# Patient Record
Sex: Female | Born: 1935 | Race: White | Hispanic: No | Smoking: Never smoker
Health system: Southern US, Community
[De-identification: ages and names within clinical notes are randomized; demographics above are authoritative.]

## PROBLEM LIST (undated history)

## (undated) DIAGNOSIS — M899 Disorder of bone, unspecified: Secondary | ICD-10-CM

## (undated) DIAGNOSIS — W19XXXA Unspecified fall, initial encounter: Secondary | ICD-10-CM

## (undated) DIAGNOSIS — E039 Hypothyroidism, unspecified: Secondary | ICD-10-CM

## (undated) DIAGNOSIS — S42301A Unspecified fracture of shaft of humerus, right arm, initial encounter for closed fracture: Secondary | ICD-10-CM

## (undated) DIAGNOSIS — E669 Obesity, unspecified: Secondary | ICD-10-CM

## (undated) DIAGNOSIS — M1990 Unspecified osteoarthritis, unspecified site: Secondary | ICD-10-CM

## (undated) DIAGNOSIS — S83209A Unspecified tear of unspecified meniscus, current injury, unspecified knee, initial encounter: Secondary | ICD-10-CM

## (undated) DIAGNOSIS — I10 Essential (primary) hypertension: Secondary | ICD-10-CM

## (undated) DIAGNOSIS — K219 Gastro-esophageal reflux disease without esophagitis: Secondary | ICD-10-CM

## (undated) DIAGNOSIS — Z8639 Personal history of other endocrine, nutritional and metabolic disease: Secondary | ICD-10-CM

## (undated) DIAGNOSIS — M7120 Synovial cyst of popliteal space [Baker], unspecified knee: Secondary | ICD-10-CM

## (undated) HISTORY — PX: SHOULDER SURGERY: SHX246

## (undated) HISTORY — PX: FOOT SURGERY: SHX648

## (undated) HISTORY — PX: DILATION AND CURETTAGE OF UTERUS: SHX78

## (undated) HISTORY — PX: EYE SURGERY: SHX253

## (undated) HISTORY — PX: ANKLE SURGERY: SHX546

## (undated) HISTORY — PX: TUBAL LIGATION: SHX77

---

## 1972-10-28 HISTORY — PX: ABDOMINAL HYSTERECTOMY: SHX81

## 2009-07-10 ENCOUNTER — Encounter: Admission: RE | Admit: 2009-07-10 | Discharge: 2009-07-10 | Payer: Self-pay | Admitting: Orthopedic Surgery

## 2009-07-12 ENCOUNTER — Ambulatory Visit (HOSPITAL_BASED_OUTPATIENT_CLINIC_OR_DEPARTMENT_OTHER): Admission: RE | Admit: 2009-07-12 | Discharge: 2009-07-13 | Payer: Self-pay | Admitting: Orthopedic Surgery

## 2011-02-01 LAB — BASIC METABOLIC PANEL
BUN: 19 mg/dL (ref 6–23)
CO2: 30 mEq/L (ref 19–32)
Chloride: 106 mEq/L (ref 96–112)
Creatinine, Ser: 1.23 mg/dL — ABNORMAL HIGH (ref 0.4–1.2)
Glucose, Bld: 146 mg/dL — ABNORMAL HIGH (ref 70–99)

## 2015-12-22 ENCOUNTER — Other Ambulatory Visit: Payer: Self-pay | Admitting: Orthopedic Surgery

## 2015-12-22 DIAGNOSIS — M1712 Unilateral primary osteoarthritis, left knee: Secondary | ICD-10-CM

## 2016-01-05 ENCOUNTER — Ambulatory Visit
Admission: RE | Admit: 2016-01-05 | Discharge: 2016-01-05 | Disposition: A | Discharge status: Home / Self Care | Payer: Self-pay | Source: Ambulatory Visit | Attending: Orthopedic Surgery | Admitting: Orthopedic Surgery

## 2016-01-05 ENCOUNTER — Ambulatory Visit
Admission: RE | Admit: 2016-01-05 | Discharge: 2016-01-05 | Disposition: A | Discharge status: Home / Self Care | Payer: Medicare Other | Source: Ambulatory Visit | Attending: Orthopedic Surgery | Admitting: Orthopedic Surgery

## 2016-01-05 DIAGNOSIS — M1712 Unilateral primary osteoarthritis, left knee: Secondary | ICD-10-CM

## 2016-03-30 ENCOUNTER — Ambulatory Visit: Payer: Self-pay | Admitting: Orthopedic Surgery

## 2016-05-01 ENCOUNTER — Encounter (HOSPITAL_COMMUNITY): Payer: Self-pay

## 2016-05-01 ENCOUNTER — Other Ambulatory Visit: Payer: Self-pay

## 2016-05-01 ENCOUNTER — Encounter (HOSPITAL_COMMUNITY)
Admission: RE | Admit: 2016-05-01 | Discharge: 2016-05-01 | Disposition: A | Discharge status: Home / Self Care | Payer: Medicare Other | Source: Ambulatory Visit | Attending: Orthopedic Surgery | Admitting: Orthopedic Surgery

## 2016-05-01 DIAGNOSIS — I10 Essential (primary) hypertension: Secondary | ICD-10-CM | POA: Insufficient documentation

## 2016-05-01 DIAGNOSIS — Z01812 Encounter for preprocedural laboratory examination: Secondary | ICD-10-CM | POA: Diagnosis not present

## 2016-05-01 DIAGNOSIS — Z01818 Encounter for other preprocedural examination: Secondary | ICD-10-CM | POA: Insufficient documentation

## 2016-05-01 DIAGNOSIS — I440 Atrioventricular block, first degree: Secondary | ICD-10-CM | POA: Diagnosis not present

## 2016-05-01 DIAGNOSIS — M1712 Unilateral primary osteoarthritis, left knee: Secondary | ICD-10-CM | POA: Insufficient documentation

## 2016-05-01 DIAGNOSIS — Z0183 Encounter for blood typing: Secondary | ICD-10-CM | POA: Insufficient documentation

## 2016-05-01 HISTORY — DX: Personal history of other endocrine, nutritional and metabolic disease: Z86.39

## 2016-05-01 HISTORY — DX: Unspecified fracture of shaft of humerus, right arm, initial encounter for closed fracture: S42.301A

## 2016-05-01 HISTORY — DX: Unspecified osteoarthritis, unspecified site: M19.90

## 2016-05-01 HISTORY — DX: Gastro-esophageal reflux disease without esophagitis: K21.9

## 2016-05-01 HISTORY — DX: Essential (primary) hypertension: I10

## 2016-05-01 HISTORY — DX: Disorder of bone, unspecified: M89.9

## 2016-05-01 HISTORY — DX: Hypothyroidism, unspecified: E03.9

## 2016-05-01 HISTORY — DX: Unspecified tear of unspecified meniscus, current injury, unspecified knee, initial encounter: S83.209A

## 2016-05-01 HISTORY — DX: Unspecified fall, initial encounter: W19.XXXA

## 2016-05-01 HISTORY — DX: Synovial cyst of popliteal space (Baker), unspecified knee: M71.20

## 2016-05-01 HISTORY — DX: Obesity, unspecified: E66.9

## 2016-05-01 LAB — PROTIME-INR
INR: 0.96 (ref 0.00–1.49)
Prothrombin Time: 13 seconds (ref 11.6–15.2)

## 2016-05-01 LAB — COMPREHENSIVE METABOLIC PANEL
ALBUMIN: 4.5 g/dL (ref 3.5–5.0)
ALK PHOS: 71 U/L (ref 38–126)
ALT: 27 U/L (ref 14–54)
AST: 25 U/L (ref 15–41)
Anion gap: 8 (ref 5–15)
BILIRUBIN TOTAL: 0.8 mg/dL (ref 0.3–1.2)
BUN: 18 mg/dL (ref 6–20)
CO2: 29 mmol/L (ref 22–32)
CREATININE: 1.05 mg/dL — AB (ref 0.44–1.00)
Calcium: 10.1 mg/dL (ref 8.9–10.3)
Chloride: 100 mmol/L — ABNORMAL LOW (ref 101–111)
GFR calc Af Amer: 57 mL/min — ABNORMAL LOW (ref >60)
GFR, EST NON AFRICAN AMERICAN: 50 mL/min — AB (ref >60)
GLUCOSE: 143 mg/dL — AB (ref 65–99)
POTASSIUM: 3.6 mmol/L (ref 3.5–5.1)
Sodium: 137 mmol/L (ref 135–145)
TOTAL PROTEIN: 6.8 g/dL (ref 6.5–8.1)

## 2016-05-01 LAB — URINALYSIS, ROUTINE W REFLEX MICROSCOPIC
BILIRUBIN URINE: NEGATIVE
Glucose, UA: NEGATIVE mg/dL
Hgb urine dipstick: NEGATIVE
KETONES UR: NEGATIVE mg/dL
LEUKOCYTES UA: NEGATIVE
NITRITE: NEGATIVE
Protein, ur: NEGATIVE mg/dL
SPECIFIC GRAVITY, URINE: 1.011 (ref 1.005–1.030)
pH: 7 (ref 5.0–8.0)

## 2016-05-01 LAB — SURGICAL PCR SCREEN
MRSA, PCR: NEGATIVE
STAPHYLOCOCCUS AUREUS: NEGATIVE

## 2016-05-01 LAB — APTT: aPTT: 31 seconds (ref 24–37)

## 2016-05-01 LAB — ABO/RH: ABO/RH(D): O POS

## 2016-05-01 NOTE — Progress Notes (Addendum)
OV note per chart per Wynn Bankerebra Davis NP 04/11/2016 with clearance noted  CBC results per chart 04/11/2016

## 2016-05-01 NOTE — Patient Instructions (Signed)
Kristin PaceMary A Scioli  05/01/2016   Your procedure is scheduled on: Wednesday May 08, 2016  [facility]  Entrance [place]  elevators to 3rd floor to  Short Stay Center at 1:00 PM.  Call this number if you have problems the morning of surgery [phone]   Remember: ONLY 1 PERSON MAY GO WITH YOU TO SHORT STAY TO GET  READY MORNING OF YOUR SURGERY.  Do not eat food After Midnight but may take clear liquids till 9:45 am day of surgery then nothing by mouth.      Take these medicines the morning of surgery with A SIP OF WATER: Levothyroxine                                You may not have any metal on your body including hair pins and              piercings  Do not wear jewelry, make-up, lotions, powders or perfumes, deodorant             Do not wear nail polish.  Do not shave  48 hours prior to surgery.             Do not bring valuables to the hospital. [place] IS NOT             RESPONSIBLE   FOR VALUABLES.  Contacts, dentures or bridgework may not be worn into surgery.  Leave suitcase in the car. After surgery it may be brought to your room.               Please read over the following fact sheets you were given:MRSA INFORMATION SHEET; INCENTIVE SPIROMETER; BLOOD TRANSFUSION INFORMATION SHEET  _____________________________________________________________________             [facility] - Preparing for Surgery Before surgery, you can play an important role.  Because skin is not sterile, your skin needs to be as free of germs as possible.  You can reduce the number of germs on your skin by washing with CHG (chlorahexidine gluconate) soap before surgery.  CHG is an antiseptic cleaner which kills germs and bonds with the skin to continue killing germs even after washing. Please DO NOT use if you have an allergy to CHG or antibacterial soaps.  If your skin becomes reddened/irritated stop using the CHG and inform your nurse when you arrive at Short  Stay. Do not shave (including legs and underarms) for at least 48 hours prior to the first CHG shower.  You may shave your face/neck. Please follow these instructions carefully:  1.  Shower with CHG Soap the night before surgery and the  morning of Surgery.  2.  If you choose to wash your hair, wash your hair first as usual with your  normal  shampoo.  3.  After you shampoo, rinse your hair and body thoroughly to remove the  shampoo.                           4.  Use CHG as you would any other liquid soap.  You can apply chg directly  to the skin and wash                       Gently  with a scrungie or clean washcloth.  5.  Apply the CHG Soap to your body ONLY FROM THE NECK DOWN.   Do not use on face/ open                           Wound or open sores. Avoid contact with eyes, ears mouth and genitals (private parts).                       Wash face,  Genitals (private parts) with your normal soap.             6.  Wash thoroughly, paying special attention to the area where your surgery  will be performed.  7.  Thoroughly rinse your body with warm water from the neck down.  8.  DO NOT shower/wash with your normal soap after using and rinsing off  the CHG Soap.                9.  Pat yourself dry with a clean towel.            10.  Wear clean pajamas.            11.  Place clean sheets on your bed the night of your first shower and do not  sleep with pets. Day of Surgery : Do not apply any lotions/deodorants the morning of surgery.  Please wear clean clothes to the hospital/surgery center.  FAILURE TO FOLLOW THESE INSTRUCTIONS MAY RESULT IN THE CANCELLATION OF YOUR SURGERY PATIENT SIGNATURE_________________________________  NURSE SIGNATURE__________________________________  ________________________________________________________________________    CLEAR LIQUID DIET   Foods Allowed                                                                     Foods Excluded  Coffee and tea,  regular and decaf                             liquids that you cannot  Plain Jell-O in any flavor                                             see through such as: Fruit ices (not with fruit pulp)                                     milk, soups, orange juice  Iced Popsicles                                    All solid food Carbonated beverages, regular and diet                                    Cranberry, grape and apple juices Sports drinks like Gatorade Lightly seasoned clear broth or consume(fat free) Sugar, honey syrup  Sample  Menu Breakfast                                Lunch                                     Supper Cranberry juice                    Beef broth                            Chicken broth Jell-O                                     Grape juice                           Apple juice Coffee or tea                        Jell-O                                      Popsicle                                                Coffee or tea                        Coffee or tea  _____________________________________________________________________    Incentive Spirometer  An incentive spirometer is a tool that can help keep your lungs clear and active. This tool measures how well you are filling your lungs with each breath. Taking long deep breaths may help reverse or decrease the chance of developing breathing (pulmonary) problems (especially infection) following:  A long period of time when you are unable to move or be active. BEFORE THE PROCEDURE   If the spirometer includes an indicator to show your best effort, your nurse or respiratory therapist will set it to a desired goal.  If possible, sit up straight or lean slightly forward. Try not to slouch.  Hold the incentive spirometer in an upright position. INSTRUCTIONS FOR USE   Sit on the edge of your bed if possible, or sit up as far as you can in bed or on a chair.  Hold the incentive spirometer in an upright  position.  Breathe out normally.  Place the mouthpiece in your mouth and seal your lips tightly around it.  Breathe in slowly and as deeply as possible, raising the piston or the ball toward the top of the column.  Hold your breath for 3-5 seconds or for as long as possible. Allow the piston or ball to fall to the bottom of the column.  Remove the mouthpiece from your mouth and breathe out normally.  Rest for a few seconds and repeat Steps 1 through 7 at least 10 times every 1-2 hours when you are awake. Take your time and take a few normal breaths between deep  breaths.  The spirometer may include an indicator to show your best effort. Use the indicator as a goal to work toward during each repetition.  After each set of 10 deep breaths, practice coughing to be sure your lungs are clear. If you have an incision (the cut made at the time of surgery), support your incision when coughing by placing a pillow or rolled up towels firmly against it. Once you are able to get out of bed, walk around indoors and cough well. You may stop using the incentive spirometer when instructed by your caregiver.  RISKS AND COMPLICATIONS  Take your time so you do not get dizzy or light-headed.  If you are in pain, you may need to take or ask for pain medication before doing incentive spirometry. It is harder to take a deep breath if you are having pain. AFTER USE  Rest and breathe slowly and easily.  It can be helpful to keep track of a log of your progress. Your caregiver can provide you with a simple table to help with this. If you are using the spirometer at home, follow these instructions: [place] IF:   You are having difficultly using the spirometer.  You have trouble using the spirometer as often as instructed.  Your pain medication is not giving enough relief while using the spirometer.  You develop fever of 100.5 F (38.1 C) or higher. SEEK IMMEDIATE MEDICAL CARE IF:   You cough  up bloody sputum that had not been present before.  You develop fever of 102 F (38.9 C) or greater.  You develop worsening pain at or near the incision site. MAKE SURE YOU:   Understand these instructions.  Will watch your condition.  Will get help right away if you are not doing well or get worse. Document Released: 02/24/2007 Document Revised: 01/06/2012 Document Reviewed: 04/27/2007 ExitCare Patient Information 2014 ExitCare, [place].   ________________________________________________________________________  WHAT IS A BLOOD TRANSFUSION? Blood Transfusion Information  A transfusion is the replacement of blood or some of its parts. Blood is made up of multiple cells which provide different functions.  Red blood cells carry oxygen and are used for blood loss replacement.  White blood cells fight against infection.  Platelets control bleeding.  Plasma helps clot blood.  Other blood products are available for specialized needs, such as hemophilia or other clotting disorders. BEFORE THE TRANSFUSION  Who gives blood for transfusions?   Healthy volunteers who are fully evaluated to make sure their blood is safe. This is blood bank blood. Transfusion therapy is the safest it has ever been in the practice of medicine. Before blood is taken from a donor, a complete history is taken to make sure that person has no history of diseases nor engages in risky social behavior (examples are intravenous drug use or sexual activity with multiple partners). The donor's travel history is screened to minimize risk of transmitting infections, such as malaria. The donated blood is tested for signs of infectious diseases, such as HIV and hepatitis. The blood is then tested to be sure it is compatible with you in order to minimize the chance of a transfusion reaction. If you or a relative donates blood, this is often done in anticipation of surgery and is not appropriate for emergency situations. It takes  many days to process the donated blood. RISKS AND COMPLICATIONS Although transfusion therapy is very safe and saves many lives, the main dangers of transfusion include:   Getting an infectious disease.  Developing a transfusion reaction. This is an allergic reaction to something in the blood you were given. Every precaution is taken to prevent this. The decision to have a blood transfusion has been considered carefully by your caregiver before blood is given. Blood is not given unless the benefits outweigh the risks. AFTER THE TRANSFUSION  Right after receiving a blood transfusion, you will usually feel much better and more energetic. This is especially true if your red blood cells have gotten low (anemic). The transfusion raises the level of the red blood cells which carry oxygen, and this usually causes an energy increase.  The nurse administering the transfusion will monitor you carefully for complications. HOME CARE INSTRUCTIONS  No special instructions are needed after a transfusion. You may find your energy is better. Speak with your caregiver about any limitations on activity for underlying diseases you may have. SEEK MEDICAL CARE IF:   Your condition is not improving after your transfusion.  You develop redness or irritation at the intravenous (IV) site. SEEK IMMEDIATE MEDICAL CARE IF:  Any of the following symptoms occur over the next 12 hours:  Shaking chills.  You have a temperature by mouth above 102 F (38.9 C), not controlled by medicine.  Chest, back, or muscle pain.  People around you feel you are not acting correctly or are confused.  Shortness of breath or difficulty breathing.  Dizziness and fainting.  You get a rash or develop hives.  You have a decrease in urine output.  Your urine turns a dark color or changes to pink, red, or brown. Any of the following symptoms occur over the next 10 days:  You have a temperature by mouth above 102 F (38.9 C), not  controlled by medicine.  Shortness of breath.  Weakness after normal activity.  The white part of the eye turns yellow (jaundice).  You have a decrease in the amount of urine or are urinating less often.  Your urine turns a dark color or changes to pink, red, or brown. Document Released: 10/11/2000 Document Revised: 01/06/2012 Document Reviewed: 05/30/2008 [facility] Patient Information 2014 [place], [place].  _______________________________________________________________________

## 2016-05-07 ENCOUNTER — Ambulatory Visit: Payer: Self-pay | Admitting: Orthopedic Surgery

## 2016-05-07 NOTE — H&P (Signed)
Kristin PaceMary A Barnett DOB: 01/02/1936 Married / Language: English / Race: White Female Date of Admission:  05/08/2016 CC:  Left Knee Pain History of Present Illness  The patient is a 80 year old female who comes in  for a preoperative History and Physical. The patient is scheduled for a left unicompartmental replacement to be performed by Dr. Gus RankinFrank V. Aluisio, MD at [facility] on 7/12/207. The patient is a 80 year old female who presented for follow up of their knee. The patient is being followed for their bilateral knee pain and osteoarthritis. They are now out from bilateral cortisone injections; done by Dr. Ranell PatrickNorris. Symptoms reported include: pain, grinding, instability and difficulty ambulating. The patient feels that they are doing poorly. The following medication has been used for pain control: antiinflammatory medication (Aleve PM, at night). The patient has reported improvement of their symptoms with: Cortisone injections. She would like to discuss if this is the best course of treatment, vs total knee). I have seen [name] back in February and we talked about proceeding with unicompartmental arthroplasty. We reviewed her radiographs and she does have isolated medial compartment disease on the left with bone-on-bone changes. No significant deformity. Patellofemoral shows minimal change and lateral shows no change. At this point, the most predictable means of improving pain and function is unicompartmental knee arthroplasty. The procedure, risks, potential complications and rehab course are discussed in detail and the patient elects to proceed. It is felt that she would be a good candidate for unicompartmental replacement. Patient is instructed to follow up after surgery. They have been treated conservatively in the past for the above stated problem and despite conservative measures, they continue to have progressive pain and severe functional limitations and dysfunction. They have failed  non-operative management including home exercise, medications, and injections. It is felt that they would benefit from undergoing total joint replacement. Risks and benefits of the procedure have been discussed with the patient and they elect to proceed with surgery. There are no active contraindications to surgery such as ongoing infection or rapidly progressive neurological disease.  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  Family History  Cancer  First Degree Relatives. mother, brother and grandmother mothers side Congestive Heart Failure  father Diabetes Mellitus  grandfather mothers side and grandmother fathers side Heart Disease  mother, father, brother and grandfather fathers side Hypertension  father and brother  Social History  Pain Contract  no Alcohol use  never consumed alcohol Tobacco use  Never smoker. never smoker Children  3 Copy of Drug/Alcohol Rehab (Previously)  no Current work status  working full time Drug/Alcohol Rehab (Currently)  no Exercise  Exercises rarely Illicit drug use  no Marital status  Widowed. Number of flights of stairs before winded  2-3 Living situation  Lives with relatives. Son lives with patient.  Medication History Aleve (220MG  Capsule, 1 (one) Oral) Active. Synthroid (50MCG Tablet, Oral) Active. Chlorthalidone (25MG  Tablet, Oral) Active. Vitamin D (Oral) Specific strength unknown - Active. Centrum Vitamints (Oral) Active. Atenolol (50MG  Tablet, Oral) Active. ZyrTEC Allergy (10MG  Tablet, Oral) Active. NexIUM (20MG  Capsule DR, Oral) Active. (Alternates every other night with  Zegerid) Zegerid (40-1100MG  Capsule, Oral) Active. (Alternates every other night with Nexium) Tylenol Extra Strength (500MG  Tablet, Oral) Active.  Past Surgical History Foot Surgery  right Arthroscopy of Shoulder  right Hysterectomy  complete (non-cancerous)    Review of Systems General Not Present- Chills, Fatigue, Fever, Memory Loss, Night Sweats, Weight Gain and Weight Loss. Skin Not Present- Eczema, Hives, Itching, Lesions and Rash. HEENT Not Present- Dentures, Double Vision, Headache, Hearing Loss, Tinnitus and Visual Loss. Respiratory Not Present- Allergies, Chronic Cough, Coughing up blood, Shortness of breath at rest and Shortness of breath with exertion. Cardiovascular Not Present- Chest Pain, Difficulty Breathing Lying Down, Murmur, Palpitations, Racing/skipping heartbeats and Swelling. Gastrointestinal Not Present- Abdominal Pain, Bloody Stool, Constipation, Diarrhea, Difficulty Swallowing, Heartburn, Jaundice, Loss of appetitie, Nausea and Vomiting. Female Genitourinary Not Present- Blood in Urine, Discharge, Flank Pain, Incontinence, Painful Urination, Urgency, Urinary frequency, Urinary Retention, Urinating at Night and Weak urinary stream. Musculoskeletal Present- Joint Pain and Joint Swelling. Not Present- Back Pain, Morning Stiffness, Muscle Pain, Muscle Weakness and Spasms. Neurological Not Present- Blackout spells, Difficulty with balance, Dizziness, Paralysis, Tremor and Weakness. Psychiatric Not Present- Insomnia.  Vitals Weight: 230 lb Height: 63in Weight was reported by patient. Height was reported by patient. Body Surface Area: 2.05 m Body Mass Index: 40.74 kg/m  Pulse: 60 (Regular)  BP: 138/82 (Sitting, Left Arm, Standard)  Physical Exam  General Mental Status -Alert, cooperative and good historian. General Appearance-pleasant, Not in acute distress. Orientation-Oriented X3. Build & Nutrition-Well nourished and Well  developed.  Head and Neck Head-normocephalic, atraumatic . Neck Global Assessment - supple, no bruit auscultated on the right, no bruit auscultated on the left.  Eye Pupil - Bilateral-Regular and Round. Motion - Bilateral-EOMI.  Chest and Lung Exam Auscultation Breath sounds - clear at anterior chest wall and clear at posterior chest wall. Adventitious sounds - No Adventitious sounds.  Cardiovascular Auscultation Rhythm - Regular rate and rhythm. Heart Sounds - S1 WNL and S2 WNL. Murmurs & Other Heart Sounds - Auscultation of the heart reveals - No Murmurs.  Abdomen Inspection Contour - Generalized mild distention. Palpation/Percussion Tenderness - Abdomen is non-tender to palpation. Rigidity (guarding) - Abdomen is soft. Auscultation Auscultation of the abdomen reveals - Bowel sounds normal.  Female Genitourinary Note: Not done, not pertinent to present illness   Musculoskeletal Note: On exam, she is alert and oriented, in no apparent distress. Evaluation of her left knee shows no effusion. Her range of motion of the knee is 0 to 125 degrees. She is tender medially. There is no lateral tenderness or instability. There is slight swelling posteriorly.  RADIOGRAPHS We reviewed her radiographs and she does have isolated medial compartment disease on the left with bone-on-bone changes. No significant deformity. Patellofemoral shows minimal change and lateral shows no change.   Assessment & Plan  Primary osteoarthritis of left knee (M17.12) Primary osteoarthritis of right knee (M17.11)  Note:Surgical Plans: Left Uni Knee Replacement  Disposition: Home  PCP: Evelene Croon, FNP - Patient has been seen preoperatively and felt to be stable for surgery. "Medically cleared for knee replacement surgery."  IV TXA  Anesthesia Issues: None  Signed electronically by Beckey Rutter, III PA-

## 2016-05-08 ENCOUNTER — Encounter (HOSPITAL_COMMUNITY): Payer: Self-pay | Admitting: Certified Registered Nurse Anesthetist

## 2016-05-08 ENCOUNTER — Ambulatory Visit (HOSPITAL_COMMUNITY): Payer: Medicare Other | Admitting: Anesthesiology

## 2016-05-08 ENCOUNTER — Observation Stay (HOSPITAL_COMMUNITY)
Admission: RE | Admit: 2016-05-08 | Discharge: 2016-05-09 | Disposition: A | Discharge status: Home / Self Care | Payer: Medicare Other | Source: Ambulatory Visit | Attending: Orthopedic Surgery | Admitting: Orthopedic Surgery

## 2016-05-08 ENCOUNTER — Encounter (HOSPITAL_COMMUNITY)
Admission: RE | Disposition: A | Discharge status: Home / Self Care | Payer: Self-pay | Source: Ambulatory Visit | Attending: Orthopedic Surgery

## 2016-05-08 DIAGNOSIS — Z79899 Other long term (current) drug therapy: Secondary | ICD-10-CM | POA: Diagnosis not present

## 2016-05-08 DIAGNOSIS — M25562 Pain in left knee: Secondary | ICD-10-CM | POA: Diagnosis present

## 2016-05-08 DIAGNOSIS — E669 Obesity, unspecified: Secondary | ICD-10-CM | POA: Diagnosis not present

## 2016-05-08 DIAGNOSIS — K219 Gastro-esophageal reflux disease without esophagitis: Secondary | ICD-10-CM | POA: Insufficient documentation

## 2016-05-08 DIAGNOSIS — M170 Bilateral primary osteoarthritis of knee: Principal | ICD-10-CM | POA: Insufficient documentation

## 2016-05-08 DIAGNOSIS — Z791 Long term (current) use of non-steroidal anti-inflammatories (NSAID): Secondary | ICD-10-CM | POA: Insufficient documentation

## 2016-05-08 DIAGNOSIS — Z6841 Body Mass Index (BMI) 40.0 and over, adult: Secondary | ICD-10-CM | POA: Diagnosis not present

## 2016-05-08 DIAGNOSIS — E039 Hypothyroidism, unspecified: Secondary | ICD-10-CM | POA: Insufficient documentation

## 2016-05-08 DIAGNOSIS — I10 Essential (primary) hypertension: Secondary | ICD-10-CM | POA: Insufficient documentation

## 2016-05-08 DIAGNOSIS — M1712 Unilateral primary osteoarthritis, left knee: Secondary | ICD-10-CM

## 2016-05-08 DIAGNOSIS — M179 Osteoarthritis of knee, unspecified: Secondary | ICD-10-CM | POA: Diagnosis present

## 2016-05-08 DIAGNOSIS — M1710 Unilateral primary osteoarthritis, unspecified knee: Secondary | ICD-10-CM | POA: Diagnosis present

## 2016-05-08 HISTORY — PX: INJECTION KNEE: SHX2446

## 2016-05-08 HISTORY — PX: PARTIAL KNEE ARTHROPLASTY: SHX2174

## 2016-05-08 LAB — TYPE AND SCREEN
ABO/RH(D): O POS
ANTIBODY SCREEN: NEGATIVE

## 2016-05-08 SURGERY — ARTHROPLASTY, KNEE, UNICOMPARTMENTAL
Anesthesia: Spinal | Site: Knee | Laterality: Right

## 2016-05-08 MED ORDER — METOCLOPRAMIDE HCL 5 MG/ML IJ SOLN: 5.0000 mg | Freq: Three times a day (TID) | INTRAMUSCULAR | Status: DC | PRN

## 2016-05-08 MED ORDER — RIVAROXABAN 10 MG PO TABS
10.0000 mg | ORAL_TABLET | Freq: Every day | ORAL | Status: DC
  Administered 2016-05-09: 10 mg via ORAL
  Filled 2016-05-08: qty 1

## 2016-05-08 MED ORDER — BUPIVACAINE HCL (PF) 0.25 % IJ SOLN
INTRAMUSCULAR | Status: DC | PRN
  Administered 2016-05-08: 20 mL

## 2016-05-08 MED ORDER — MENTHOL 3 MG MT LOZG: 1.0000 | LOZENGE | OROMUCOSAL | Status: DC | PRN

## 2016-05-08 MED ORDER — PROPOFOL 500 MG/50ML IV EMUL
INTRAVENOUS | Status: DC | PRN
  Administered 2016-05-08 (×2): 30 mg via INTRAVENOUS

## 2016-05-08 MED ORDER — BUPIVACAINE HCL (PF) 0.25 % IJ SOLN
INTRAMUSCULAR | Status: AC
  Filled 2016-05-08: qty 30

## 2016-05-08 MED ORDER — PRAVASTATIN SODIUM 20 MG PO TABS
10.0000 mg | ORAL_TABLET | Freq: Every day | ORAL | Status: DC
  Filled 2016-05-08: qty 1

## 2016-05-08 MED ORDER — ACETAMINOPHEN 325 MG PO TABS: 650.0000 mg | ORAL_TABLET | Freq: Four times a day (QID) | ORAL | Status: DC | PRN

## 2016-05-08 MED ORDER — SODIUM CHLORIDE 0.9 % IJ SOLN
INTRAMUSCULAR | Status: DC | PRN
  Administered 2016-05-08: 30 mL

## 2016-05-08 MED ORDER — PROPOFOL 10 MG/ML IV BOLUS
INTRAVENOUS | Status: AC
  Filled 2016-05-08: qty 40

## 2016-05-08 MED ORDER — FLEET ENEMA 7-19 GM/118ML RE ENEM: 1.0000 | ENEMA | Freq: Once | RECTAL | Status: DC | PRN

## 2016-05-08 MED ORDER — ATENOLOL 25 MG PO TABS
50.0000 mg | ORAL_TABLET | Freq: Every day | ORAL | Status: DC
  Administered 2016-05-08: 50 mg via ORAL
  Filled 2016-05-08 (×2): qty 2

## 2016-05-08 MED ORDER — FENTANYL CITRATE (PF) 100 MCG/2ML IJ SOLN
INTRAMUSCULAR | Status: AC
  Filled 2016-05-08: qty 2

## 2016-05-08 MED ORDER — METHOCARBAMOL 1000 MG/10ML IJ SOLN
500.0000 mg | Freq: Four times a day (QID) | INTRAVENOUS | Status: DC | PRN
  Administered 2016-05-08: 500 mg via INTRAVENOUS
  Filled 2016-05-08: qty 5
  Filled 2016-05-08: qty 550

## 2016-05-08 MED ORDER — SODIUM CHLORIDE 0.9 % IV SOLN
INTRAVENOUS | Status: DC
  Administered 2016-05-08: 14:00:00 via INTRAVENOUS

## 2016-05-08 MED ORDER — ACETAMINOPHEN 10 MG/ML IV SOLN
INTRAVENOUS | Status: AC
  Filled 2016-05-08: qty 100

## 2016-05-08 MED ORDER — LORATADINE 10 MG PO TABS
10.0000 mg | ORAL_TABLET | Freq: Every day | ORAL | Status: DC
Start: 2016-05-08 — End: 2016-05-09
  Filled 2016-05-08: qty 1

## 2016-05-08 MED ORDER — CHLORTHALIDONE 25 MG PO TABS
25.0000 mg | ORAL_TABLET | Freq: Every day | ORAL | Status: DC
  Administered 2016-05-09: 25 mg via ORAL
  Filled 2016-05-08: qty 1

## 2016-05-08 MED ORDER — ACETAMINOPHEN 10 MG/ML IV SOLN
1000.0000 mg | Freq: Once | INTRAVENOUS | Status: AC
  Administered 2016-05-08: 1000 mg via INTRAVENOUS
  Filled 2016-05-08: qty 100

## 2016-05-08 MED ORDER — ONDANSETRON HCL 4 MG/2ML IJ SOLN: 4.0000 mg | Freq: Four times a day (QID) | INTRAMUSCULAR | Status: DC | PRN

## 2016-05-08 MED ORDER — ACETAMINOPHEN 500 MG PO TABS
1000.0000 mg | ORAL_TABLET | Freq: Four times a day (QID) | ORAL | Status: DC
  Filled 2016-05-08: qty 2

## 2016-05-08 MED ORDER — FENTANYL CITRATE (PF) 100 MCG/2ML IJ SOLN
INTRAMUSCULAR | Status: DC | PRN
  Administered 2016-05-08 (×2): 50 ug via INTRAVENOUS

## 2016-05-08 MED ORDER — DIPHENHYDRAMINE HCL 12.5 MG/5ML PO ELIX: 12.5000 mg | ORAL_SOLUTION | ORAL | Status: DC | PRN

## 2016-05-08 MED ORDER — MEPERIDINE HCL 50 MG/ML IJ SOLN: 6.2500 mg | INTRAMUSCULAR | Status: DC | PRN

## 2016-05-08 MED ORDER — TRAMADOL HCL 50 MG PO TABS
50.0000 mg | ORAL_TABLET | Freq: Four times a day (QID) | ORAL | Status: DC | PRN
  Administered 2016-05-08: 100 mg via ORAL
  Filled 2016-05-08: qty 2

## 2016-05-08 MED ORDER — TRANEXAMIC ACID 1000 MG/10ML IV SOLN
1000.0000 mg | INTRAVENOUS | Status: AC
  Administered 2016-05-08: 1000 mg via INTRAVENOUS
  Filled 2016-05-08: qty 10

## 2016-05-08 MED ORDER — DOCUSATE SODIUM 100 MG PO CAPS
100.0000 mg | ORAL_CAPSULE | Freq: Two times a day (BID) | ORAL | Status: DC
  Administered 2016-05-08 – 2016-05-09 (×2): 100 mg via ORAL
  Filled 2016-05-08 (×2): qty 1

## 2016-05-08 MED ORDER — LACTATED RINGERS IV SOLN
INTRAVENOUS | Status: DC
  Administered 2016-05-08 (×2): via INTRAVENOUS

## 2016-05-08 MED ORDER — METOCLOPRAMIDE HCL 5 MG PO TABS: 5.0000 mg | ORAL_TABLET | Freq: Three times a day (TID) | ORAL | Status: DC | PRN

## 2016-05-08 MED ORDER — CHLORHEXIDINE GLUCONATE 4 % EX LIQD: 60.0000 mL | Freq: Once | CUTANEOUS | Status: DC

## 2016-05-08 MED ORDER — BUPIVACAINE HCL (PF) 0.75 % IJ SOLN
INTRAMUSCULAR | Status: DC | PRN
  Administered 2016-05-08: 1.6 mL via INTRATHECAL

## 2016-05-08 MED ORDER — PHENOL 1.4 % MT LIQD
1.0000 | OROMUCOSAL | Status: DC | PRN
  Filled 2016-05-08: qty 177

## 2016-05-08 MED ORDER — DEXAMETHASONE SODIUM PHOSPHATE 10 MG/ML IJ SOLN
10.0000 mg | Freq: Once | INTRAMUSCULAR | Status: AC
  Administered 2016-05-09: 10 mg via INTRAVENOUS
  Filled 2016-05-08: qty 1

## 2016-05-08 MED ORDER — FENTANYL CITRATE (PF) 100 MCG/2ML IJ SOLN: 25.0000 ug | INTRAMUSCULAR | Status: DC | PRN

## 2016-05-08 MED ORDER — BISACODYL 10 MG RE SUPP
10.0000 mg | Freq: Every day | RECTAL | Status: DC | PRN
Start: 2016-05-08 — End: 2016-05-09

## 2016-05-08 MED ORDER — PANTOPRAZOLE SODIUM 40 MG PO TBEC
80.0000 mg | DELAYED_RELEASE_TABLET | Freq: Every day | ORAL | Status: DC
  Administered 2016-05-09: 80 mg via ORAL
  Filled 2016-05-08: qty 2

## 2016-05-08 MED ORDER — PROPOFOL 500 MG/50ML IV EMUL
INTRAVENOUS | Status: DC | PRN
  Administered 2016-05-08: 50 ug/kg/min via INTRAVENOUS

## 2016-05-08 MED ORDER — OXYCODONE HCL 5 MG PO TABS
5.0000 mg | ORAL_TABLET | ORAL | Status: DC | PRN
  Administered 2016-05-08 (×2): 5 mg via ORAL
  Administered 2016-05-08 – 2016-05-09 (×2): 10 mg via ORAL
  Administered 2016-05-09: 5 mg via ORAL
  Filled 2016-05-08 (×3): qty 1
  Filled 2016-05-08: qty 2
  Filled 2016-05-08: qty 1
  Filled 2016-05-08: qty 2

## 2016-05-08 MED ORDER — BUPIVACAINE LIPOSOME 1.3 % IJ SUSP
20.0000 mL | Freq: Once | INTRAMUSCULAR | Status: DC
  Filled 2016-05-08: qty 20

## 2016-05-08 MED ORDER — DEXAMETHASONE SODIUM PHOSPHATE 10 MG/ML IJ SOLN
10.0000 mg | Freq: Once | INTRAMUSCULAR | Status: AC
  Administered 2016-05-08: 10 mg via INTRAVENOUS

## 2016-05-08 MED ORDER — METOCLOPRAMIDE HCL 5 MG/ML IJ SOLN: 10.0000 mg | Freq: Once | INTRAMUSCULAR | Status: DC | PRN

## 2016-05-08 MED ORDER — SODIUM CHLORIDE 0.9 % IJ SOLN
INTRAMUSCULAR | Status: AC
  Filled 2016-05-08: qty 50

## 2016-05-08 MED ORDER — ONDANSETRON HCL 4 MG PO TABS: 4.0000 mg | ORAL_TABLET | Freq: Four times a day (QID) | ORAL | Status: DC | PRN

## 2016-05-08 MED ORDER — 0.9 % SODIUM CHLORIDE (POUR BTL) OPTIME
TOPICAL | Status: DC | PRN
  Administered 2016-05-08: 1000 mL

## 2016-05-08 MED ORDER — METHYLPREDNISOLONE ACETATE 80 MG/ML IJ SUSP
INTRAMUSCULAR | Status: DC | PRN
  Administered 2016-05-08: 80 mg

## 2016-05-08 MED ORDER — LEVOTHYROXINE SODIUM 50 MCG PO TABS
50.0000 ug | ORAL_TABLET | Freq: Every day | ORAL | Status: DC
  Administered 2016-05-09: 50 ug via ORAL
  Filled 2016-05-08: qty 1

## 2016-05-08 MED ORDER — METHYLPREDNISOLONE ACETATE 40 MG/ML IJ SUSP
INTRAMUSCULAR | Status: AC
  Filled 2016-05-08: qty 2

## 2016-05-08 MED ORDER — ACETAMINOPHEN 650 MG RE SUPP: 650.0000 mg | Freq: Four times a day (QID) | RECTAL | Status: DC | PRN

## 2016-05-08 MED ORDER — CEFAZOLIN SODIUM-DEXTROSE 2-4 GM/100ML-% IV SOLN
2.0000 g | Freq: Four times a day (QID) | INTRAVENOUS | Status: AC
  Administered 2016-05-08 (×2): 2 g via INTRAVENOUS
  Filled 2016-05-08 (×2): qty 100

## 2016-05-08 MED ORDER — CEFAZOLIN SODIUM-DEXTROSE 2-4 GM/100ML-% IV SOLN
INTRAVENOUS | Status: AC
  Filled 2016-05-08: qty 100

## 2016-05-08 MED ORDER — SODIUM CHLORIDE 0.9 % IR SOLN
Status: DC | PRN
  Administered 2016-05-08: 1000 mL

## 2016-05-08 MED ORDER — POLYETHYLENE GLYCOL 3350 17 G PO PACK: 17.0000 g | PACK | Freq: Every day | ORAL | Status: DC | PRN

## 2016-05-08 MED ORDER — MORPHINE SULFATE (PF) 2 MG/ML IV SOLN
1.0000 mg | INTRAVENOUS | Status: DC | PRN
  Administered 2016-05-09: 1 mg via INTRAVENOUS
  Filled 2016-05-08: qty 1

## 2016-05-08 MED ORDER — CEFAZOLIN SODIUM-DEXTROSE 2-4 GM/100ML-% IV SOLN
2.0000 g | INTRAVENOUS | Status: AC
  Administered 2016-05-08: 2 g via INTRAVENOUS

## 2016-05-08 MED ORDER — BUPIVACAINE LIPOSOME 1.3 % IJ SUSP
INTRAMUSCULAR | Status: DC | PRN
  Administered 2016-05-08: 20 mL

## 2016-05-08 MED ORDER — METHOCARBAMOL 500 MG PO TABS
500.0000 mg | ORAL_TABLET | Freq: Four times a day (QID) | ORAL | Status: DC | PRN
  Administered 2016-05-08: 500 mg via ORAL
  Filled 2016-05-08: qty 1

## 2016-05-08 SURGICAL SUPPLY — 44 items
BAG ZIPLOCK 12X15 (MISCELLANEOUS) ×4 IMPLANT
BANDAGE ACE 6X5 VEL STRL LF (GAUZE/BANDAGES/DRESSINGS) ×4 IMPLANT
BLADE SAW RECIPROCATING 77.5 (BLADE) ×4 IMPLANT
BLADE SAW SGTL 13.0X1.19X90.0M (BLADE) ×4 IMPLANT
BOWL SMART MIX CTS (DISPOSABLE) ×4 IMPLANT
BUR OVAL CARBIDE 4.0 (BURR) ×4 IMPLANT
CAPT KNEE PARTIAL 2 ×2 IMPLANT
CEMENT HV SMART SET (Cement) ×4 IMPLANT
CLOSURE WOUND 1/2 X4 (GAUZE/BANDAGES/DRESSINGS) ×1
CLOTH BEACON ORANGE TIMEOUT ST (SAFETY) ×4 IMPLANT
CUFF TOURN SGL QUICK 34 (TOURNIQUET CUFF) ×2
CUFF TRNQT CYL 34X4X40X1 (TOURNIQUET CUFF) ×2 IMPLANT
DRSG ADAPTIC 3X8 NADH LF (GAUZE/BANDAGES/DRESSINGS) ×4 IMPLANT
DRSG PAD ABDOMINAL 8X10 ST (GAUZE/BANDAGES/DRESSINGS) ×4 IMPLANT
DURAPREP 26ML APPLICATOR (WOUND CARE) ×4 IMPLANT
ELECT REM PT RETURN 9FT ADLT (ELECTROSURGICAL) ×4
ELECTRODE REM PT RTRN 9FT ADLT (ELECTROSURGICAL) ×2 IMPLANT
EVACUATOR 1/8 PVC DRAIN (DRAIN) ×4 IMPLANT
GAUZE SPONGE 4X4 12PLY STRL (GAUZE/BANDAGES/DRESSINGS) ×4 IMPLANT
GLOVE BIO SURGEON STRL SZ7.5 (GLOVE) ×4 IMPLANT
GLOVE BIO SURGEON STRL SZ8 (GLOVE) ×4 IMPLANT
GLOVE BIOGEL PI IND STRL 7.0 (GLOVE) ×2 IMPLANT
GLOVE BIOGEL PI IND STRL 7.5 (GLOVE) ×2 IMPLANT
GLOVE BIOGEL PI IND STRL 8 (GLOVE) ×4 IMPLANT
GLOVE BIOGEL PI INDICATOR 7.0 (GLOVE) ×2
GLOVE BIOGEL PI INDICATOR 7.5 (GLOVE) ×2
GLOVE BIOGEL PI INDICATOR 8 (GLOVE) ×4
GLOVE SURG SS PI 7.0 STRL IVOR (GLOVE) ×4 IMPLANT
GLOVE SURG SS PI 7.5 STRL IVOR (GLOVE) ×4 IMPLANT
GOWN STRL REUS W/TWL LRG LVL3 (GOWN DISPOSABLE) ×4 IMPLANT
GOWN STRL REUS W/TWL XL LVL3 (GOWN DISPOSABLE) ×8 IMPLANT
HANDPIECE INTERPULSE COAX TIP (DISPOSABLE) ×2
KIT IMPL STRL TIB IPOLY IUNI ×2 IMPLANT
MANIFOLD NEPTUNE II (INSTRUMENTS) ×4 IMPLANT
PACK TOTAL KNEE CUSTOM (KITS) ×4 IMPLANT
PADDING CAST COTTON 6X4 STRL (CAST SUPPLIES) ×4 IMPLANT
SET HNDPC FAN SPRY TIP SCT (DISPOSABLE) ×2 IMPLANT
STRIP CLOSURE SKIN 1/2X4 (GAUZE/BANDAGES/DRESSINGS) ×3 IMPLANT
SUT MNCRL AB 4-0 PS2 18 (SUTURE) ×4 IMPLANT
SUT VIC AB 2-0 CT1 27 (SUTURE) ×4
SUT VIC AB 2-0 CT1 TAPERPNT 27 (SUTURE) ×4 IMPLANT
SUT VLOC 180 0 24IN GS25 (SUTURE) ×4 IMPLANT
SYR 50ML LL SCALE MARK (SYRINGE) ×4 IMPLANT
WRAP KNEE MAXI GEL POST OP (GAUZE/BANDAGES/DRESSINGS) ×4 IMPLANT

## 2016-05-08 NOTE — Op Note (Signed)
OPERATIVE REPORT  PREOPERATIVE DIAGNOSIS: Medial compartment osteoarthritis, Bilateral knees  POSTOPERATIVE DIAGNOSIS: Medial compartment osteoarthritis, Bilateral knees  PROCEDURE:Left knee medial unicompartmental arthroplasty.    Right knee cortisone injection  SURGEON: Ollen GrossFrank Lavonya Hoerner, MD   ASSISTANT: Leilani AbleSteve Chabon, PA-C  ANESTHESIA:  Spinal.   ESTIMATED BLOOD LOSS: Minimal.   DRAINS: Hemovac x1.   TOURNIQUET TIME: 33 minutes at 300 mmHg.   COMPLICATIONS: None.   CONDITION: Stable to recovery.   BRIEF CLINICAL NOTE:Kristin Barnett is a 80 y.o. female, who has  significant isolated medial compartment arthritis of the Left knee. She has had nonoperative management including injections. She has had  cortisone and viscous supplements. Unfortunately, the pain persists.  Radiograph showed isolated medial compartment bone-on-bone arthritis  with normal-appearing patellofemoral and lateral compartments. She  presents now for left knee unicompartmental arthroplasty. She also has right knee OA and requests cortisone injection.  PROCEDURE IN DETAIL: After successful administration of  Spinal anesthetic, a tourniquet was placed high on the  Left thigh and left lower extremity prepped and draped in usual sterile fashion. Extremity was wrapped in an Esmarch, knee flexed, and tourniquet inflated to 300 mmHg. A midline incision was made with a 10 blade through subcutaneous  tissue to the extensor mechanism. A fresh blade was used to make a  medial parapatellar arthrotomy. Soft tissue on the proximal medial  tibia subperiosteally elevated to the joint line with a knife and into  the semimembranosus bursa with a Cobb elevator. The patella was  subluxed laterally, and the knee flexed 90 degrees. The ACL was intact.  The marginal osteophytes on the medial femur and tibia were removed with  a rongeur. The medial meniscus was also removed. The femoral cutting  block where the  conformis unicompartmental knee system was placed along  the femur. There was excellent fit. I traced the outline. We then  removed any remaining cartilage within this outline. We then placed the  cutting block again and pinned in position. The posterior femoral cut  was made, it was approximately 5 mm. The lug holes for the femoral  component were then drilled through the cutting block. The cutting  block was subsequently removed. We then utilized the high speed burr to  create a small trough at the superior aspect of the components that of  the inset and would not overhang the cartilage. The trial was placed,  it had excellent fit. The trial was subsequently removed.       The trial was placed again and the B chip was placed. There was  excellent balance throughout full motion. Also with excellent fit on  her tibia. This was removed as was the femoral trial. A curette was  used to remove any remaining cartilage from the tibia. The tibial  cutting block was then placed and there was a perfect fit on the tibial  surface. The appropriate slope was placed and it was pinned in  position. The reciprocating saw was used to make the central cut and  then the oscillating saw used to make the horizontal cut. The bone  fragment was then removed. The tibial trial was placed and had perfect  fit on the tibia. We then drilled the 2 lug holes and did the keel punch.  We then placed tibia trial femur, and a 6 mm trial insert. There was  excellent stability throughout full range of motion and no impingement.  The trial was then removed. We drilled small holes in the distal  femur  in order to create more conduits for the cement. The cut bone  surfaces were thoroughly irrigated with pulsatile lavage while the  cement was mixed on the back table. We then cemented the tibial  component into place, impacted it and removed the extruded cement. The  same was done for the femoral component. Trial 6-mm inserts  placed,  knee held in full extension, and all extruded cement removed. While the  cement was hardening, I injected the extensor mechanism, periosteum of  the femur and subcu tissues, a total of 20 mL of Exparel mixed with 30  mL of saline and then did an additional injection of 20 mL of 0.25%  Marcaine into the same tissues. When the cement had fully hardened,  then the permanent polyethylene was placed in tibial tray. There was  excellent stability throughout full range of motion with no lift off the  component and no evidence of any impingement. Wound was copiously  irrigated with saline solution, and the arthrotomy closed over a Hemovac  drain with a running #1 V-Loc suture. The subcutaneous was closed with  interrupted 2-0 Vicryl and subcuticular running 4-0 Monocryl. The drain  was hooked to suction. Incision cleaned and dried and Steri-Strips and  a bulky sterile dressing applied. The tourniquet was released after a  total time of 33 minutes. This was done after closing the extensor  mechanism. The wound was closed and a bulky sterile dressing was  Applied.      I then injected her right knee with 80 mg (2 ml) of Depomedrol after a sterile prep with Betadine. She was then awakened and  transported to recovery room in stable condition.  Please note that a surgical assistant was a medical necessity for this  procedure in order to perform it in a safe and expeditious manner.  Assistance was necessary for retracting vital ligaments, neurovascular  structures, as well as for proper positioning of the limb to allow for  appropriate bone cuts and appropriate placement of the prosthesis.    Gus Rankin Rafeef Lau, MD

## 2016-05-08 NOTE — Anesthesia Preprocedure Evaluation (Signed)
Anesthesia Evaluation  Patient identified by MRN, date of birth, ID band Patient awake    Reviewed: Allergy & Precautions, NPO status , Patient's Chart, lab work & pertinent test results  Airway Mallampati: II  TM Distance: >3 FB Neck ROM: Full    Dental no notable dental hx.    Pulmonary neg pulmonary ROS,    Pulmonary exam normal breath sounds clear to auscultation       Cardiovascular hypertension, Pt. on medications negative cardio ROS Normal cardiovascular exam Rhythm:Regular Rate:Normal     Neuro/Psych negative neurological ROS  negative psych ROS   GI/Hepatic negative GI ROS, Neg liver ROS,   Endo/Other  negative endocrine ROS  Renal/GU negative Renal ROS  negative genitourinary   Musculoskeletal negative musculoskeletal ROS (+)   Abdominal   Peds negative pediatric ROS (+)  Hematology negative hematology ROS (+)   Anesthesia Other Findings   Reproductive/Obstetrics negative OB ROS                             Anesthesia Physical Anesthesia Plan  ASA: II  Anesthesia Plan: Spinal   Post-op Pain Management:    Induction:   Airway Management Planned: Simple Face Mask  Additional Equipment:   Intra-op Plan:   Post-operative Plan:   Informed Consent: I have reviewed the patients History and Physical, chart, labs and discussed the procedure including the risks, benefits and alternatives for the proposed anesthesia with the patient or authorized representative who has indicated his/her understanding and acceptance.   Dental advisory given  Plan Discussed with: CRNA  Anesthesia Plan Comments:         Anesthesia Quick Evaluation  

## 2016-05-08 NOTE — Transfer of Care (Signed)
Immediate Anesthesia Transfer of Care Note  Patient: Patrecia PaceMary A Single  Procedure(s) Performed: Procedure(s): UNICOMPARTMENTAL LEFT KNEE (Left) KNEE CORTISONE INJECTION (Right)  Patient Location: PACU  Anesthesia Type:Spinal  Level of Consciousness: awake, alert  and oriented  Airway & Oxygen Therapy: Patient Spontanous Breathing and Patient connected to face mask oxygen  Post-op Assessment: Report given to RN and Post -op Vital signs reviewed and stable  Post vital signs: Reviewed and stable  Last Vitals:  Filed Vitals:   05/08/16 0711  BP: 168/78  Pulse: 73  Temp: 36.7 C  Resp: 18    Last Pain: There were no vitals filed for this visit.    Patients Stated Pain Goal: 4 (05/08/16 0730)  Complications: No apparent anesthesia complications

## 2016-05-08 NOTE — Anesthesia Postprocedure Evaluation (Signed)
Anesthesia Post Note  Patient: Kristin Barnett  Procedure(s) Performed: Procedure(s) (LRB): UNICOMPARTMENTAL LEFT KNEE (Left) KNEE CORTISONE INJECTION (Right)  Patient location during evaluation: PACU Anesthesia Type: Spinal Level of consciousness: awake and alert Pain management: pain level controlled Vital Signs Assessment: post-procedure vital signs reviewed and stable Respiratory status: spontaneous breathing and respiratory function stable Cardiovascular status: blood pressure returned to baseline and stable Postop Assessment: no headache, no backache and spinal receding Anesthetic complications: no    Last Vitals:  Filed Vitals:   05/08/16 1245 05/08/16 1300  BP: 142/62 142/62  Pulse: 62 64  Temp: 36.8 C 36.8 C  Resp: 14 16    Last Pain:  Filed Vitals:   05/08/16 1321  PainSc: 0-No pain                 Phillips Groutarignan, Anisten Tomassi

## 2016-05-08 NOTE — Anesthesia Procedure Notes (Signed)
Spinal Patient location during procedure: ICU Start time: 05/08/2016 10:50 AM End time: 05/08/2016 10:09 AM Staffing Anesthesiologist: Phillips GroutARIGNAN, PETER Performed by: anesthesiologist  Preanesthetic Checklist Completed: patient identified, site marked, surgical consent, pre-op evaluation, timeout performed, IV checked, risks and benefits discussed and monitors and equipment checked Spinal Block Patient position: sitting Prep: Betadine Patient monitoring: heart rate, continuous pulse ox and blood pressure Approach: midline Location: L3-4 Injection technique: single-shot Needle Needle type: Spinocan  Needle gauge: 22 G Needle length: 9 cm Needle insertion depth: 8 cm

## 2016-05-08 NOTE — Interval H&P Note (Signed)
History and Physical Interval Note:  05/08/2016 8:31 AM  Kristin Barnett  has presented today for surgery, with the diagnosis of left knee medial compartment osteoarthritis  The various methods of treatment have been discussed with the patient and family. After consideration of risks, benefits and other options for treatment, the patient has consented to  Procedure(s): UNICOMPARTMENTAL LEFT KNEE (Left) as a surgical intervention .  The patient's history has been reviewed, patient examined, no change in status, stable for surgery.  I have reviewed the patient's chart and labs.  Questions were answered to the patient's satisfaction.     Loanne DrillingALUISIO,Elisha Mcgruder V

## 2016-05-08 NOTE — H&P (View-Only) (Signed)
Kristin Barnett DOB: 01/02/1936 Married / Language: English / Race: White Female Date of Admission:  05/08/2016 CC:  Left Knee Pain History of Present Illness  The patient is a 80 year old female who comes in  for a preoperative History and Physical. The patient is scheduled for a left unicompartmental replacement to be performed by Dr. Gus RankinFrank V. Aluisio, [facility]. The patient is a 80 year old female who presented for follow up of their knee. The patient is being followed for their bilateral knee pain and osteoarthritis. They are now out from bilateral cortisone injections; done by Dr. Ranell PatrickNorris. Symptoms reported include: pain, grinding, instability and difficulty ambulating. The patient feels that they are doing poorly. The following medication has been used for pain control: antiinflammatory medication (Aleve PM, at night). The patient has reported improvement of their symptoms with: Cortisone injections. She would like to discuss if this is the best course of treatment, vs total knee). I have seen Kristin Barnett back in February and we talked about proceeding with unicompartmental arthroplasty. We reviewed her radiographs and she does have isolated medial compartment disease on the left with bone-on-bone changes. No significant deformity. Patellofemoral shows minimal change and lateral shows no change. At this point, the most predictable means of improving pain and function is unicompartmental knee arthroplasty. The procedure, risks, potential complications and rehab course are discussed in detail and the patient elects to proceed. It is felt that she would be a good candidate for unicompartmental replacement. Patient is instructed to follow up after surgery. They have been treated conservatively in the past for the above stated problem and despite conservative measures, they continue to have progressive pain and severe functional limitations and dysfunction. They have failed  non-operative management including home exercise, medications, and injections. It is felt that they would benefit from undergoing total joint replacement. Risks and benefits of the procedure have been discussed with the patient and they elect to proceed with surgery. There are no active contraindications to surgery such as ongoing infection or rapidly progressive neurological disease.  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  Family History  Cancer  First Degree Relatives. mother, brother and grandmother mothers side Congestive Heart Failure  father Diabetes Mellitus  grandfather mothers side and grandmother fathers side Heart Disease  mother, father, brother and grandfather fathers side Hypertension  father and brother  Social History  Pain Contract  no Alcohol use  never consumed alcohol Tobacco use  Never smoker. never smoker Children  3 Copy of Drug/Alcohol Rehab (Previously)  no Current work status  working full time Drug/Alcohol Rehab (Currently)  no Exercise  Exercises rarely Illicit drug use  no Marital status  Widowed. Number of flights of stairs before winded  2-3 Living situation  Lives with relatives. Son lives with patient.  Medication History Aleve (220MG  Capsule, 1 (one) Oral) Active. Synthroid (50MCG Tablet, Oral) Active. Chlorthalidone (25MG  Tablet, Oral) Active. Vitamin D (Oral) Specific strength unknown - Active. Centrum Vitamints (Oral) Active. Atenolol (50MG  Tablet, Oral) Active. ZyrTEC Allergy (10MG  Tablet, Oral) Active. NexIUM (20MG  Capsule DR, Oral) Active. (Alternates every other night with  Zegerid) Zegerid (40-1100MG  Capsule, Oral) Active. (Alternates every other night with Nexium) Tylenol Extra Strength (500MG  Tablet, Oral) Active.  Past Surgical History Foot Surgery  right Arthroscopy of Shoulder  right Hysterectomy  complete (non-cancerous)    Review of Systems General Not Present- Chills, Fatigue, Fever, Memory Loss, Night Sweats, Weight Gain and Weight Loss. Skin Not Present- Eczema, Hives, Itching, Lesions and Rash. HEENT Not Present- Dentures, Double Vision, Headache, Hearing Loss, Tinnitus and Visual Loss. Respiratory Not Present- Allergies, Chronic Cough, Coughing up blood, Shortness of breath at rest and Shortness of breath with exertion. Cardiovascular Not Present- Chest Pain, Difficulty Breathing Lying Down, Murmur, Palpitations, Racing/skipping heartbeats and Swelling. Gastrointestinal Not Present- Abdominal Pain, Bloody Stool, Constipation, Diarrhea, Difficulty Swallowing, Heartburn, Jaundice, Loss of appetitie, Nausea and Vomiting. Female Genitourinary Not Present- Blood in Urine, Discharge, Flank Pain, Incontinence, Painful Urination, Urgency, Urinary frequency, Urinary Retention, Urinating at Night and Weak urinary stream. Musculoskeletal Present- Joint Pain and Joint Swelling. Not Present- Back Pain, Morning Stiffness, Muscle Pain, Muscle Weakness and Spasms. Neurological Not Present- Blackout spells, Difficulty with balance, Dizziness, Paralysis, Tremor and Weakness. Psychiatric Not Present- Insomnia.  Vitals Weight: 230 lb Height: 63in Weight was reported by patient. Height was reported by patient. Body Surface Area: 2.05 m Body Mass Index: 40.74 kg/m  Pulse: 60 (Regular)  BP: 138/82 (Sitting, Left Arm, Standard)  Physical Exam  General Mental Status -Alert, cooperative and good historian. General Appearance-pleasant, Not in acute distress. Orientation-Oriented X3. Build & Nutrition-Well nourished and Well  developed.  Head and Neck Head-normocephalic, atraumatic . Neck Global Assessment - supple, no bruit auscultated on the right, no bruit auscultated on the left.  Eye Pupil - Bilateral-Regular and Round. Motion - Bilateral-EOMI.  Chest and Lung Exam Auscultation Breath sounds - clear at anterior chest wall and clear at posterior chest wall. Adventitious sounds - No Adventitious sounds.  Cardiovascular Auscultation Rhythm - Regular rate and rhythm. Heart Sounds - S1 WNL and S2 WNL. Murmurs & Other Heart Sounds - Auscultation of the heart reveals - No Murmurs.  Abdomen Inspection Contour - Generalized mild distention. Palpation/Percussion Tenderness - Abdomen is non-tender to palpation. Rigidity (guarding) - Abdomen is soft. Auscultation Auscultation of the abdomen reveals - Bowel sounds normal.  Female Genitourinary Note: Not done, not pertinent to present illness   Musculoskeletal Note: On exam, she is alert and oriented, in no apparent distress. Evaluation of her left knee shows no effusion. Her range of motion of the knee is 0 to 125 degrees. She is tender medially. There is no lateral tenderness or instability. There is slight swelling posteriorly.  RADIOGRAPHS We reviewed her radiographs and she does have isolated medial compartment disease on the left with bone-on-bone changes. No significant deformity. Patellofemoral shows minimal change and lateral shows no change.   Assessment & Plan  Primary osteoarthritis of left knee (M17.12) Primary osteoarthritis of right knee (M17.11)  Note:Surgical Plans: Left Uni Knee Replacement  Disposition: Home  PCP: Debra Lynne Davis, FNP - Patient has been seen preoperatively and felt to be stable for surgery. "Medically cleared for knee replacement surgery."  IV TXA  Anesthesia Issues: None  Signed electronically by Alezandrew L Leopoldo Mazzie, III PA- 

## 2016-05-08 NOTE — Evaluation (Signed)
Physical Therapy Evaluation Patient Details Name: Kristin Barnett MRN: [id number] DOB: Jan 07, 1936 Today's Date: 05/08/2016   History of Present Illness  L Uni knee replacement. R knee cortisone injection  Clinical Impression  The patient ambulated x 20'. Plans to go home. Daughter  With her through Sunday. Then will be home alone at night with friends during the day.Pt admitted with above diagnosis. Pt currently with functional limitations due to the deficits listed below (see PT Problem List).  Pt will benefit from skilled PT to increase their independence and safety with mobility to allow discharge to the venue listed below.       Follow Up Recommendations Home health PT;Supervision/Assistance - 24 hour    Equipment Recommendations  None recommended by PT    Recommendations for Other Services       Precautions / Restrictions Precautions Precautions: Fall;Knee Restrictions Weight Bearing Restrictions: No      Mobility  Bed Mobility Overal bed mobility: Needs Assistance Bed Mobility: Supine to Sit     Supine to sit: Min assist;HOB elevated     General bed mobility comments: support the  left leg, cues on technique  Transfers Overall transfer level: Needs assistance Equipment used: Rolling walker (2 wheeled) Transfers: Sit to/from Stand Sit to Stand: Min assist         General transfer comment: cues  for hand and R leg position  Ambulation/Gait Ambulation/Gait assistance: Min assist Ambulation Distance (Feet): 20 Feet Assistive device: Rolling walker (2 wheeled) Gait Pattern/deviations: Step-to pattern;Antalgic;Decreased step length - left;Decreased stance time - left     General Gait Details: cues for sequence  Stairs            Wheelchair Mobility    Modified Rankin (Stroke Patients Only)       Balance                                             Pertinent Vitals/Pain Pain Assessment: 0-10 Pain Score: 4  Pain Location: L  knee. Pain Descriptors / Indicators: Aching;Discomfort;Grimacing Pain Intervention(s): Limited activity within patient's tolerance;Monitored during session;Premedicated before session;Repositioned;Ice applied    Home Living Family/patient expects to be discharged to:: Private residence Living Arrangements: Children;Non-relatives/Friends Available Help at Discharge: Family Type of Home: House Home Access: Ramped entrance     Home Layout: One level Home Equipment: Environmental consultant - 2 wheels;Walker - 4 wheels;Cane - single point;Bedside commode;Shower seat;Grab bars - toilet Additional Comments: Lift chair, sleeps in it    Prior Function Level of Independence: Independent with assistive device(s)               Hand Dominance        Extremity/Trunk Assessment   Upper Extremity Assessment: Defer to OT evaluation           Lower Extremity Assessment: LLE deficits/detail;RLE deficits/detail RLE Deficits / Details: wfl LLE Deficits / Details: able  to raise  the leg off the bed with a lag  Cervical / Trunk Assessment: Normal  Communication   Communication: No difficulties  Cognition Arousal/Alertness: Awake/alert Behavior During Therapy: WFL for tasks assessed/performed Overall Cognitive Status: Within Functional Limits for tasks assessed                      General Comments      Exercises Total Joint Exercises Quad Sets: AROM;Both;10 reps  Assessment/Plan    PT Assessment Patient needs continued PT services  PT Diagnosis Difficulty walking;Acute pain;Generalized weakness   PT Problem List Decreased strength;Decreased range of motion;Decreased activity tolerance;Decreased mobility;Decreased knowledge of use of DME;Decreased safety awareness;Decreased knowledge of precautions;Pain  PT Treatment Interventions DME instruction;Gait training;Stair training;Functional mobility training;Therapeutic activities;Therapeutic exercise;Patient/family education   PT  Goals (Current goals can be found in the Care Plan section) Acute Rehab PT Goals Patient Stated Goal: to go home PT Goal Formulation: With patient/family Time For Goal Achievement: 05/11/16 Potential to Achieve Goals: Good    Frequency 7X/week   Barriers to discharge        Co-evaluation               End of Session Equipment Utilized During Treatment: Gait belt Activity Tolerance: Patient tolerated treatment well Patient left: in chair;with call bell/phone within reach;with family/visitor present Nurse Communication: Mobility status    Functional Assessment Tool Used: clinical judgement Functional Limitation: Mobility: Walking and moving around Mobility: Walking and Moving Around Current Status [phone]([id number]): At least 20 percent but less than 40 percent impaired, limited or restricted Mobility: Walking and Moving Around Goal Status [phone]([phone]): At least 1 percent but less than 20 percent impaired, limited or restricted    Time: [id number] PT Time Calculation (min) (ACUTE ONLY): 21 min   Charges:   PT Evaluation $PT Eval Low Complexity: 1 Procedure     PT G Codes:   PT G-Codes **NOT FOR INPATIENT CLASS** Functional Assessment Tool Used: clinical judgement Functional Limitation: Mobility: Walking and moving around Mobility: Walking and Moving Around Current Status ([phone]([id number]): At least 20 percent but less than 40 percent impaired, limited or restricted Mobility: Walking and Moving Around Goal Status [phone]([id number]): At least 1 percent but less than 20 percent impaired, limited or restricted    Rada HayHill, Marcelyn Ruppe Elizabeth 05/08/2016, 3:50 PM Blanchard KelchKaren Jamisen Hawes PT [phone]

## 2016-05-09 DIAGNOSIS — M170 Bilateral primary osteoarthritis of knee: Secondary | ICD-10-CM | POA: Diagnosis not present

## 2016-05-09 LAB — CBC
HCT: 40.5 % (ref 36.0–46.0)
Hemoglobin: 13.8 g/dL (ref 12.0–15.0)
MCH: 31.7 pg (ref 26.0–34.0)
MCHC: 34.1 g/dL (ref 30.0–36.0)
MCV: 93.1 fL (ref 78.0–100.0)
PLATELETS: 238 10*3/uL (ref 150–400)
RBC: 4.35 MIL/uL (ref 3.87–5.11)
RDW: 12.4 % (ref 11.5–15.5)
WBC: 11.8 10*3/uL — AB (ref 4.0–10.5)

## 2016-05-09 LAB — BASIC METABOLIC PANEL
ANION GAP: 10 (ref 5–15)
BUN: 17 mg/dL (ref 6–20)
CALCIUM: 9.9 mg/dL (ref 8.9–10.3)
CO2: 26 mmol/L (ref 22–32)
CREATININE: 0.87 mg/dL (ref 0.44–1.00)
Chloride: 101 mmol/L (ref 101–111)
GFR calc Af Amer: >60 mL/min (ref >60)
GLUCOSE: 168 mg/dL — AB (ref 65–99)
Potassium: 3.8 mmol/L (ref 3.5–5.1)
Sodium: 137 mmol/L (ref 135–145)

## 2016-05-09 MED ORDER — OXYCODONE HCL 5 MG PO TABS: 5.0000 mg | ORAL_TABLET | ORAL | Status: DC | PRN

## 2016-05-09 MED ORDER — TRAMADOL HCL 50 MG PO TABS: 50.0000 mg | ORAL_TABLET | Freq: Four times a day (QID) | ORAL | Status: DC | PRN

## 2016-05-09 MED ORDER — METHOCARBAMOL 500 MG PO TABS: 500.0000 mg | ORAL_TABLET | Freq: Four times a day (QID) | ORAL | Status: DC | PRN

## 2016-05-09 MED ORDER — RIVAROXABAN 10 MG PO TABS: 10.0000 mg | ORAL_TABLET | Freq: Every day | ORAL | Status: DC

## 2016-05-09 NOTE — Discharge Instructions (Signed)
° °Dr. Frank Aluisio °[facility]ist °[facility] °[address] °[place], [place] [address] °[phone] ° °UNI KNEE REPLACEMENT POSTOPERATIVE DIRECTIONS ° ° °Knee Rehabilitation, Guidelines Following Surgery  °Results after knee surgery are often greatly improved when you follow the exercise, range of motion and muscle strengthening exercises prescribed by your doctor. Safety measures are also important to protect the knee from further injury. Any time any of these exercises cause you to have increased pain or swelling in your knee joint, decrease the amount until you are comfortable again and slowly increase them. If you have problems or questions, call your caregiver or physical therapist for advice.  ° °HOME CARE INSTRUCTIONS  °Remove items at home which could result in a fall. This includes throw rugs or furniture in walking pathways.  °· ICE to the affected knee every three hours for 30 minutes at a time and then as needed for pain and swelling.  Continue to use ice on the knee for pain and swelling from surgery. You may notice swelling that will progress down to the foot and ankle.  This is normal after surgery.  Elevate the leg when you are not up walking on it.   °· Continue to use the breathing machine which will help keep your temperature down.  It is common for your temperature to cycle up and down following surgery, especially at night when you are not up moving around and exerting yourself.  The breathing machine keeps your lungs expanded and your temperature down. °· Do not place pillow under knee, focus on keeping the knee straight while resting ° °DIET °You may resume your previous home diet once your are discharged from the hospital. ° °DRESSING / WOUND CARE / SHOWERING °You may shower 3 days after surgery, but keep the wounds dry during showering.  You may use an occlusive plastic wrap (Press'n Seal for example), NO SOAKING/SUBMERGING IN THE BATHTUB.  If the  bandage gets wet, change with a clean dry gauze.  If the incision gets wet, pat the wound dry with a clean towel. °You may start showering once you are discharged home but do not submerge the incision under water. Just pat the incision dry and apply a dry gauze dressing on daily. °Change the surgical dressing daily and reapply a dry dressing each time. ° °ACTIVITY °Walk with your walker as instructed. °Use walker as long as suggested by your caregivers. °Avoid periods of inactivity such as sitting longer than an hour when not asleep. This helps prevent blood clots.  °You may resume a sexual relationship in one month or when given the OK by your doctor.  °You may return to work once you are cleared by your doctor.  °Do not drive a car for 6 weeks or until released by you surgeon.  °Do not drive while taking narcotics. ° °WEIGHT BEARING °Weight bearing as tolerated with assist device (walker, cane, etc) as directed, use it as long as suggested by your surgeon or therapist, typically at least 4-6 weeks. ° °POSTOPERATIVE CONSTIPATION PROTOCOL °Constipation - defined medically as fewer than three stools per week and severe constipation as less than one stool per week. ° °One of the most common issues patients have following surgery is constipation.  Even if you have a regular bowel pattern at home, your normal regimen is likely to be disrupted due to multiple reasons following surgery.  Combination of anesthesia, postoperative narcotics, change in appetite and fluid intake all can affect your bowels.    In order to avoid complications following surgery, here are some recommendations in order to help you during your recovery period. ° °Colace (docusate) - Pick up an over-the-counter form of Colace or another stool softener and take twice a day as long as you are requiring postoperative pain medications.  Take with a full glass of water daily.  If you experience loose stools or diarrhea, hold the colace until you stool forms  back up.  If your symptoms do not get better within 1 week or if they get worse, check with your doctor. ° °Dulcolax (bisacodyl) - Pick up over-the-counter and take as directed by the product packaging as needed to assist with the movement of your bowels.  Take with a full glass of water.  Use this product as needed if not relieved by Colace only.  ° °MiraLax (polyethylene glycol) - Pick up over-the-counter to have on hand.  MiraLax is a solution that will increase the amount of water in your bowels to assist with bowel movements.  Take as directed and can mix with a glass of water, juice, soda, coffee, or tea.  Take if you go more than two days without a movement. °Do not use MiraLax more than once per day. Call your doctor if you are still constipated or irregular after using this medication for 7 days in a row. ° °If you continue to have problems with postoperative constipation, please contact the office for further assistance and recommendations.  If you experience "the worst abdominal pain ever" or develop nausea or vomiting, please contact the office immediatly for further recommendations for treatment. ° °ITCHING ° If you experience itching with your medications, try taking only a single pain pill, or even half a pain pill at a time.  You can also use Benadryl over the counter for itching or also to help with sleep.  ° °TED HOSE STOCKINGS °Wear the elastic stockings on both legs for three weeks following surgery during the day but you may remove then at night for sleeping. ° °MEDICATIONS °See your medication summary on the “After Visit Summary” that the nursing staff will review with you prior to discharge.  You may have some home medications which will be placed on hold until you complete the course of blood thinner medication.  It is important for you to complete the blood thinner medication as prescribed by your surgeon.  Continue your approved medications as instructed at time of  discharge. ° °PRECAUTIONS °If you experience chest pain or shortness of breath - call 911 immediately for transfer to the hospital emergency department.  °If you develop a fever greater that 101 F, purulent drainage from wound, increased redness or drainage from wound, foul odor from the wound/dressing, or calf pain - CONTACT YOUR SURGEON.   °                                                °FOLLOW-UP APPOINTMENTS °Make sure you keep all of your appointments after your operation with your surgeon and caregivers. You should call the office at the above phone number and make an appointment for approximately two weeks after the date of your surgery or on the date instructed by your surgeon outlined in the "After Visit Summary". ° °RANGE OF MOTION AND STRENGTHENING EXERCISES  °Rehabilitation of the knee is important following a knee injury or an   operation. After just a few days of immobilization, the muscles of the thigh which control the knee become weakened and shrink (atrophy). Knee exercises are designed to build up the tone and strength of the thigh muscles and to improve knee motion. Often times heat used for twenty to thirty minutes before working out will loosen up your tissues and help with improving the range of motion but do not use heat for the first two weeks following surgery. These exercises can be done on a training (exercise) mat, on the floor, on a table or on a bed. Use what ever works the best and is most comfortable for you Knee exercises include:  °Leg Lifts - While your knee is still immobilized in a splint or cast, you can do straight leg raises. Lift the leg to 60 degrees, hold for 3 sec, and slowly lower the leg. Repeat 10-20 times 2-3 times daily. Perform this exercise against resistance later as your knee gets better.  °Quad and Hamstring Sets - Tighten up the muscle on the front of the thigh (Quad) and hold for 5-10 sec. Repeat this 10-20 times hourly. Hamstring sets are done by pushing the  foot backward against an object and holding for 5-10 sec. Repeat as with quad sets.  °· Leg Slides: Lying on your back, slowly slide your foot toward your buttocks, bending your knee up off the floor (only go as far as is comfortable). Then slowly slide your foot back down until your leg is flat on the floor again. °· Angel Wings: Lying on your back spread your legs to the side as far apart as you can without causing discomfort.  °A rehabilitation program following serious knee injuries can speed recovery and prevent re-injury in the future due to weakened muscles. Contact your doctor or a physical therapist for more information on knee rehabilitation.  ° °IF YOU ARE TRANSFERRED TO A SKILLED REHAB FACILITY °If the patient is transferred to a skilled rehab facility following release from the hospital, a list of the current medications will be sent to the facility for the patient to continue.  When discharged from the skilled rehab facility, please have the facility set up the patient's Home Health Physical Therapy prior to being released. Also, the skilled facility will be responsible for providing the patient with their medications at time of release from the facility to include their pain medication, the muscle relaxants, and their blood thinner medication. If the patient is still at the rehab facility at time of the two week follow up appointment, the skilled rehab facility will also need to assist the patient in arranging follow up appointment in our office and any transportation needs. ° °MAKE SURE YOU:  °Understand these instructions.  °Get help right away if you are not doing well or get worse.  ° ° °Pick up stool softner and laxative for home use following surgery while on pain medications. °Do not submerge incision under water. °Please use good hand washing techniques while changing dressing each day. °May shower starting three days after surgery. °Please use a clean towel to pat the incision dry following  showers. °Continue to use ice for pain and swelling after surgery. °Do not use any lotions or creams on the incision until instructed by your surgeon. ° ° °Take Xarelto 10 mg daily for ten days, then change to Aspirin 325 mg daily for two weeks, then reduce to Baby Aspirin 81 mg daily for three additional weeks. ° ° ° °

## 2016-05-09 NOTE — Discharge Summary (Signed)
Physician Discharge Summary   Patient ID: Kristin Barnett MRN: [id number] DOB/AGE: 78-Nov-1937 78 y.o.  Admit date: 05/08/2016 Discharge date: 05/09/2016  Primary Diagnosis:  Medial compartment osteoarthritis, Bilateral knees  Admission Diagnoses:  Past Medical History  Diagnosis Date  . Hypertension   . GERD (gastroesophageal reflux disease)   . History of underactive thyroid   . Bone disease   . Closed right humeral fracture     secondary to fall   . Obesity   . Hypothyroidism   . Falls   . Arthritis   . Baker's cyst   . Tear of meniscus of knee    Discharge Diagnoses:   Principal Problem:   OA (osteoarthritis) of knee  Estimated body mass index is 43.23 kg/(m^2) as calculated from the following:   Height as of this encounter: _0  (1.6 m).   Weight as of this encounter: 110.678 kg (244 lb).  Procedure:  Procedure(s) (LRB): UNICOMPARTMENTAL LEFT KNEE (Left) KNEE CORTISONE INJECTION (Right)   Consults: None  HPI: Kristin Barnett is a 80 y.o. female, who has  significant isolated medial compartment arthritis of the Left knee. She has had nonoperative management including injections. She has had  cortisone and viscous supplements. Unfortunately, the pain persists.  Radiograph showed isolated medial compartment bone-on-bone arthritis  with normal-appearing patellofemoral and lateral compartments. She  presents now for left knee unicompartmental arthroplasty. She also has right knee OA and requests cortisone injection.  Laboratory Data: Admission on 05/08/2016  Component Date Value Ref Range Status  . WBC 05/09/2016 11.8* 4.0 - 10.5 K/uL Final  . RBC 05/09/2016 4.35  3.87 - 5.11 MIL/uL Final  . Hemoglobin 05/09/2016 13.8  12.0 - 15.0 g/dL Final  . HCT 05/09/2016 40.5  36.0 - 46.0 % Final  . MCV 05/09/2016 93.1  78.0 - 100.0 fL Final  . MCH 05/09/2016 31.7  26.0 - 34.0 pg Final  . MCHC 05/09/2016 34.1  30.0 - 36.0 g/dL Final  . RDW 05/09/2016 12.4  11.5 - 15.5 %  Final  . Platelets 05/09/2016 238  150 - 400 K/uL Final  . Sodium 05/09/2016 137  135 - 145 mmol/L Final  . Potassium 05/09/2016 3.8  3.5 - 5.1 mmol/L Final  . Chloride 05/09/2016 101  101 - 111 mmol/L Final  . CO2 05/09/2016 26  22 - 32 mmol/L Final  . Glucose, Bld 05/09/2016 168* 65 - 99 mg/dL Final  . BUN 05/09/2016 17  6 - 20 mg/dL Final  . Creatinine, Ser 05/09/2016 0.87  0.44 - 1.00 mg/dL Final  . Calcium 05/09/2016 9.9  8.9 - 10.3 mg/dL Final  . GFR calc non Af Amer 05/09/2016 >60  >60 mL/min Final  . GFR calc Af Amer 05/09/2016 >60  >60 mL/min Final   Comment: (NOTE) The eGFR has been calculated using the CKD EPI equation. This calculation has not been validated in all clinical situations. eGFR's persistently <60 mL/min signify possible Chronic Kidney Disease.   Georgiann Hahn gap 05/09/2016 10  5 - 15 Final  Hospital Outpatient Visit on 05/01/2016  Component Date Value Ref Range Status  . aPTT 05/01/2016 31  24 - 37 seconds Final  . Sodium 05/01/2016 137  135 - 145 mmol/L Final  . Potassium 05/01/2016 3.6  3.5 - 5.1 mmol/L Final  . Chloride 05/01/2016 100* 101 - 111 mmol/L Final  . CO2 05/01/2016 29  22 - 32 mmol/L Final  . Glucose, Bld 05/01/2016 143* 65 - 99 mg/dL Final  .  BUN 05/01/2016 18  6 - 20 mg/dL Final  . Creatinine, Ser 05/01/2016 1.05* 0.44 - 1.00 mg/dL Final  . Calcium 05/01/2016 10.1  8.9 - 10.3 mg/dL Final  . Total Protein 05/01/2016 6.8  6.5 - 8.1 g/dL Final  . Albumin 05/01/2016 4.5  3.5 - 5.0 g/dL Final  . AST 05/01/2016 25  15 - 41 U/L Final  . ALT 05/01/2016 27  14 - 54 U/L Final  . Alkaline Phosphatase 05/01/2016 71  38 - 126 U/L Final  . Total Bilirubin 05/01/2016 0.8  0.3 - 1.2 mg/dL Final  . GFR calc non Af Amer 05/01/2016 50* >60 mL/min Final  . GFR calc Af Amer 05/01/2016 57* >60 mL/min Final   Comment: (NOTE) The eGFR has been calculated using the CKD EPI equation. This calculation has not been validated in all clinical situations. eGFR's  persistently <60 mL/min signify possible Chronic Kidney Disease.   . Anion gap 05/01/2016 8  5 - 15 Final  . Prothrombin Time 05/01/2016 13.0  11.6 - 15.2 seconds Final  . INR 05/01/2016 0.96  0.00 - 1.49 Final  . ABO/RH(D) 05/01/2016 O POS   Final  . Antibody Screen 05/01/2016 NEG   Final  . Sample Expiration 05/01/2016 05/11/2016   Final  . Extend sample reason 05/01/2016 NO TRANSFUSIONS OR PREGNANCY IN THE PAST 3 MONTHS   Final  . Color, Urine 05/01/2016 YELLOW  YELLOW Final  . APPearance 05/01/2016 CLEAR  CLEAR Final  . Specific Gravity, Urine 05/01/2016 1.011  1.005 - 1.030 Final  . pH 05/01/2016 7.0  5.0 - 8.0 Final  . Glucose, UA 05/01/2016 NEGATIVE  NEGATIVE mg/dL Final  . Hgb urine dipstick 05/01/2016 NEGATIVE  NEGATIVE Final  . Bilirubin Urine 05/01/2016 NEGATIVE  NEGATIVE Final  . Ketones, ur 05/01/2016 NEGATIVE  NEGATIVE mg/dL Final  . Protein, ur 05/01/2016 NEGATIVE  NEGATIVE mg/dL Final  . Nitrite 05/01/2016 NEGATIVE  NEGATIVE Final  . Leukocytes, UA 05/01/2016 NEGATIVE  NEGATIVE Final   MICROSCOPIC NOT DONE ON URINES WITH NEGATIVE PROTEIN, BLOOD, LEUKOCYTES, NITRITE, OR GLUCOSE <1000 mg/dL.  Marland Kitchen MRSA, PCR 05/01/2016 NEGATIVE  NEGATIVE Final  . Staphylococcus aureus 05/01/2016 NEGATIVE  NEGATIVE Final   Comment:        The Xpert SA Assay (FDA approved for NASAL specimens in patients over 73 years of age), is one component of a comprehensive surveillance program.  Test performance has been validated by [facility] for patients greater than or equal to 57 year old. It is not intended to diagnose infection nor to guide or monitor treatment.   . ABO/RH(D) 05/01/2016 O POS   Final     X-Rays:No results found.  EKG: Orders placed or performed in visit on 05/01/16  . EKG 12-Lead     Hospital Course: Kristin Barnett is a 80 y.o. who was admitted to [facility]. They were brought to the operating room on 05/08/2016 and underwent Procedure(s): UNICOMPARTMENTAL  LEFT KNEE KNEE CORTISONE INJECTION.  Patient tolerated the procedure well and was later transferred to the recovery room and then to the orthopaedic floor for postoperative care.  They were given PO and IV analgesics for pain control following their surgery.  They were given 24 hours of postoperative antibiotics of  Anti-infectives    Start     Dose/Rate Route Frequency Ordered Stop   05/08/16 1600  ceFAZolin (ANCEF) IVPB 2g/100 mL premix     2 g 200 mL/hr over 30 Minutes Intravenous Every 6 hours  05/08/16 1303 05/08/16 2258   05/08/16 0708  ceFAZolin (ANCEF) IVPB 2g/100 mL premix     2 g 200 mL/hr over 30 Minutes Intravenous On call to O.R. 05/08/16 0708 05/08/16 1012     and started on DVT prophylaxis in the form of Xarelto.   PT and OT were ordered for postop therapy protocol.  Discharge planning consulted to help with postop disposition and equipment needs.  Patient had a good night on the evening of surgery.  They started to get up OOB with therapy on day one. Hemovac drain was pulled without difficulty.  Patient was seen in rounds on day one and it was felt that as long as they did well with the remaining sessions of therapy that they would be ready to go home.  Arrangements were made and they were setup to go home on POD 1.  Diet - Cardiac diet Follow up - in 2 weeks Activity - WBAT Dressing - May remove the surgical dressing tomorrow at home and then apply a dry gauze dressing daily. May shower three days following surgery but do not submerge the incision under water. Disposition - Home Condition Upon Discharge - Good D/C Meds - See DC Summary DVT Prophylaxis Xarelto 10 mg daily for ten days, then change to Aspirin 325 mg daily for two weeks, then reduce to Baby Aspirin 81 mg daily for three additional weeks.  Discharge Instructions    Call MD / Call 911    Complete by:  As directed   If you experience chest pain or shortness of breath, CALL 911 and be transported to the hospital  emergency room.  If you develope a fever above 101 F, pus (white drainage) or increased drainage or redness at the wound, or calf pain, call your surgeon's office.     Change dressing    Complete by:  As directed   Change dressing daily with sterile 4 x 4 inch gauze dressing and apply TED hose. Do not submerge the incision under water.     Constipation Prevention    Complete by:  As directed   Drink plenty of fluids.  Prune juice may be helpful.  You may use a stool softener, such as Colace (over the counter) 100 mg twice a day.  Use MiraLax (over the counter) for constipation as needed.     Diet general    Complete by:  As directed      Discharge instructions    Complete by:  As directed   Pick up stool softner and laxative for home use following surgery while on pain medications. Do not submerge incision under water. Please use good hand washing techniques while changing dressing each day. May shower starting three days after surgery. Please use a clean towel to pat the incision dry following showers. Continue to use ice for pain and swelling after surgery. Do not use any lotions or creams on the incision until instructed by your surgeon.  Xarelto 10 mg daily for ten days, then change to Aspirin 325 mg daily for two weeks, then reduce to Baby Aspirin 81 mg daily for three additional weeks.  Postoperative Constipation Protocol  Constipation - defined medically as fewer than three stools per week and severe constipation as less than one stool per week.  One of the most common issues patients have following surgery is constipation.  Even if you have a regular bowel pattern at home, your normal regimen is likely to be disrupted due to multiple reasons following  surgery.  Combination of anesthesia, postoperative narcotics, change in appetite and fluid intake all can affect your bowels.  In order to avoid complications following surgery, here are some recommendations in order to help you during  your recovery period.  Colace (docusate) - Pick up an over-the-counter form of Colace or another stool softener and take twice a day as long as you are requiring postoperative pain medications.  Take with a full glass of water daily.  If you experience loose stools or diarrhea, hold the colace until you stool forms back up.  If your symptoms do not get better within 1 week or if they get worse, check with your doctor.  Dulcolax (bisacodyl) - Pick up over-the-counter and take as directed by the product packaging as needed to assist with the movement of your bowels.  Take with a full glass of water.  Use this product as needed if not relieved by Colace only.   MiraLax (polyethylene glycol) - Pick up over-the-counter to have on hand.  MiraLax is a solution that will increase the amount of water in your bowels to assist with bowel movements.  Take as directed and can mix with a glass of water, juice, soda, coffee, or tea.  Take if you go more than two days without a movement. Do not use MiraLax more than once per day. Call your doctor if you are still constipated or irregular after using this medication for 7 days in a row.  If you continue to have problems with postoperative constipation, please contact the office for further assistance and recommendations.  If you experience "the worst abdominal pain ever" or develop nausea or vomiting, please contact the office immediatly for further recommendations for treatment.     Do not put a pillow under the knee. Place it under the heel.    Complete by:  As directed      Do not sit on low chairs, stoools or toilet seats, as it may be difficult to get up from low surfaces    Complete by:  As directed      Driving restrictions    Complete by:  As directed   No driving until released by the physician.     Increase activity slowly as tolerated    Complete by:  As directed      Lifting restrictions    Complete by:  As directed   No lifting until released by the  physician.     Patient may shower    Complete by:  As directed   You may shower without a dressing once there is no drainage.  Do not wash over the wound.  If drainage remains, do not shower until drainage stops.     TED hose    Complete by:  As directed   Use stockings (TED hose) for 3 weeks on both leg(s).  You may remove them at night for sleeping.     Weight bearing as tolerated    Complete by:  As directed   Laterality:  left  Extremity:  Lower            Medication List    STOP taking these medications        multivitamin-iron-minerals-folic acid chewable tablet     VITAMIN D3 SUPER STRENGTH 2000 units Tabs  Generic drug:  Cholecalciferol      TAKE these medications        atenolol 50 MG tablet  Commonly known as:  TENORMIN  Take 50 mg  by mouth daily.     cetirizine 10 MG tablet  Commonly known as:  ZYRTEC  Take 10 mg by mouth at bedtime.     chlorthalidone 25 MG tablet  Commonly known as:  HYGROTON  Take 25 mg by mouth daily.     esomeprazole 40 MG capsule  Commonly known as:  NEXIUM  Take 20 mg by mouth daily.     levothyroxine 50 MCG tablet  Commonly known as:  SYNTHROID, LEVOTHROID  Take 50 mcg by mouth daily.     methocarbamol 500 MG tablet  Commonly known as:  ROBAXIN  Take 1 tablet (500 mg total) by mouth every 6 (six) hours as needed for muscle spasms.     Omeprazole-Sodium Bicarbonate 20-1100 MG Caps capsule  Commonly known as:  ZEGERID  Take 1 capsule by mouth daily.     oxyCODONE 5 MG immediate release tablet  Commonly known as:  Oxy IR/ROXICODONE  Take 1-2 tablets (5-10 mg total) by mouth every 3 (three) hours as needed for moderate pain or severe pain.     pravastatin 10 MG tablet  Commonly known as:  PRAVACHOL  Take 10 mg by mouth daily.     rivaroxaban 10 MG Tabs tablet  Commonly known as:  XARELTO  Take 1 tablet (10 mg total) by mouth daily with breakfast. Xarelto 10 mg daily for ten days, then change to Aspirin 325 mg daily for  two weeks, then reduce to Baby Aspirin 81 mg daily for three additional weeks.     traMADol 50 MG tablet  Commonly known as:  ULTRAM  Take 1-2 tablets (50-100 mg total) by mouth every 6 (six) hours as needed (mild pain).           Follow-up Information    Follow up with Gearlean Alf, MD. Schedule an appointment as soon as possible for a visit on 05/21/2016.   Specialty:  Orthopedic Surgery   Why:  Call office at [phone] to setup appointment on Tuesday 05/21/2016 with Dr. Wynelle Link.   Contact information:   [address] [place] [address] [phone]       Signed: Arlee Muslim, PA-C Orthopaedic Surgery 05/09/2016, 7:35 AM

## 2016-05-09 NOTE — Care Management Note (Signed)
Case Management Note  Patient Details  Name: Kristin Barnett MRN: [id number] Date of Birth: [birth year]  Subjective/Objective:                  L Uni knee replacement. R knee cortisone injection Action/Plan: Discharge planning Expected Discharge Date:  05/09/16               Expected Discharge Plan:  [name]  In-House Referral:     Discharge planning Services  CM Consult  Post Acute Care Choice:    Choice offered to:  Patient  DME Arranged:  N/A DME Agency:  NA  HH Arranged:  PT [place] Agency:  [facility] (now [facility])  Status of Service:  Completed, signed off  If discussed at [employer] of Stay Meetings, dates discussed:    Additional Comments: Cm met with pt in room to offer choice of home health agency. Pt chooses [facility] to render HHPT.  Pt states she has all DMe at home and denies need for additional DME.  Referral given to [employer], Tim. No other Cm needs were communciated. Dellie Catholic, RN 05/09/2016, 1:54 PM

## 2016-05-09 NOTE — Progress Notes (Signed)
   Subjective: 1 Day Post-Op Procedure(s) (LRB): UNICOMPARTMENTAL LEFT KNEE (Left) KNEE CORTISONE INJECTION (Right) Patient reports pain as mild.   Patient seen in rounds by Dr. Lequita HaltAluisio. Patient is well, and has had no acute complaints or problems Patient is ready to go home later today after therapy.  Objective: Vital signs in last 24 hours: Temp:  [97.6 F (36.4 C)-98.9 F (37.2 C)] 97.9 F (36.6 C) (07/13 0653) Pulse Rate:  [55-89] 58 (07/13 0653) Resp:  [12-19] 16 (07/13 0653) BP: (113-146)/(57-79) 125/58 mmHg (07/13 0653) SpO2:  [92 %-100 %] 96 % (07/13 0653) Weight:  [110.678 kg (244 lb)] 110.678 kg (244 lb) (07/12 0730)  Intake/Output from previous day:  Intake/Output Summary (Last 24 hours) at 05/09/16 0725 Last data filed at 05/09/16 0654  Gross per 24 hour  Intake 2881.92 ml  Output   1460 ml  Net 1421.92 ml    Labs:  Recent Labs  05/09/16 0407  HGB 13.8    Recent Labs  05/09/16 0407  WBC 11.8*  RBC 4.35  HCT 40.5  PLT 238    Recent Labs  05/09/16 0407  NA 137  K 3.8  CL 101  CO2 26  BUN 17  CREATININE 0.87  GLUCOSE 168*  CALCIUM 9.9   No results for input(s): LABPT, INR in the last 72 hours.  EXAM: General - Patient is Alert, Appropriate and Oriented Extremity - Neurovascular intact Sensation intact distally Dorsiflexion/Plantar flexion intact Drssing - clean, dry Motor Function - intact, moving foot and toes well on exam.   Assessment/Plan: 1 Day Post-Op Procedure(s) (LRB): UNICOMPARTMENTAL LEFT KNEE (Left) KNEE CORTISONE INJECTION (Right) Procedure(s) (LRB): UNICOMPARTMENTAL LEFT KNEE (Left) KNEE CORTISONE INJECTION (Right) Past Medical History  Diagnosis Date  . Hypertension   . GERD (gastroesophageal reflux disease)   . History of underactive thyroid   . Bone disease   . Closed right humeral fracture     secondary to fall   . Obesity   . Hypothyroidism   . Falls   . Arthritis   . Baker's cyst   . Tear of meniscus  of knee    Principal Problem:   OA (osteoarthritis) of knee  Estimated body mass index is 43.23 kg/(m^2) as calculated from the following:   Height as of this encounter: 5\' 3"  (1.6 m).   Weight as of this encounter: 110.678 kg (244 lb). Up with therapy Discharge home with home health Diet - Cardiac diet Follow up - in 2 weeks Activity - WBAT Disposition - Home Condition Upon Discharge - Improving D/C Meds - See DC Summary DVT Prophylaxis - Xarelto  Avel Peacerew Neeraj Housand, PA-C Orthopaedic Surgery 05/09/2016, 7:25 AM

## 2016-05-09 NOTE — Progress Notes (Signed)
Occupational Therapy Evaluation Patient Details Name: TENNILLE MONTELONGO MRN: [id number] DOB: 29-Aug-1936 Today's Date: 05/09/2016    History of Present Illness L Uni knee replacement. R knee cortisone injection   Clinical Impression   All OT education completed and pt questions answered. No further OT needs at this time. Will sign off.    Follow Up Recommendations  No OT follow up;Supervision/Assistance - 24 hour    Equipment Recommendations  None recommended by OT    Recommendations for Other Services       Precautions / Restrictions Precautions Precautions: Fall;Knee Restrictions Weight Bearing Restrictions: No Other Position/Activity Restrictions: WBAT      Mobility Bed Mobility               General bed mobility comments: NT -- up in recliner  Transfers Overall transfer level: Needs assistance Equipment used: Rolling walker (2 wheeled) Transfers: Sit to/from Stand Sit to Stand: Supervision              Balance                                            ADL Overall ADL's : Needs assistance/impaired     Grooming: Supervision/safety;Standing   Upper Body Bathing: Set up;Sitting   Lower Body Bathing: Moderate assistance;Minimal assistance;Sit to/from stand   Upper Body Dressing : Set up;Sitting   Lower Body Dressing: Moderate assistance;Sit to/from stand   Toilet Transfer: Supervision/safety;Ambulation;BSC;RW   Toileting- Clothing Manipulation and Hygiene: Supervision/safety;Sit to/from stand   Tub/ Shower Transfer: Walk-in shower;Min guard;Shower seat;Grab bars;Rolling walker   Functional mobility during ADLs: Supervision/safety;Rolling walker       Vision     Perception     Praxis      Pertinent Vitals/Pain Pain Assessment: 0-10 Pain Score: 3  Pain Location: L knee Pain Descriptors / Indicators: Aching;Sore Pain Intervention(s): Monitored during session;Repositioned;Ice applied     Hand Dominance      Extremity/Trunk Assessment Upper Extremity Assessment Upper Extremity Assessment: Overall WFL for tasks assessed   Lower Extremity Assessment Lower Extremity Assessment: Defer to PT evaluation   Cervical / Trunk Assessment Cervical / Trunk Assessment: Normal   Communication Communication Communication: No difficulties   Cognition Arousal/Alertness: Awake/alert Behavior During Therapy: WFL for tasks assessed/performed Overall Cognitive Status: Within Functional Limits for tasks assessed                     General Comments       Exercises       Shoulder Instructions      Home Living Family/patient expects to be discharged to:: Private residence Living Arrangements: Children;Non-relatives/Friends Available Help at Discharge: Family Type of Home: House Home Access: Ramped entrance     Home Layout: One level     Bathroom Shower/Tub: Producer, television/film/video: Standard     Home Equipment: Environmental consultant - 2 wheels;Walker - 4 wheels;Cane - single point;Bedside commode;Shower seat;Grab bars - toilet   Additional Comments: Lift chair, sleeps in it      Prior Functioning/Environment Level of Independence: Independent with assistive device(s)             OT Diagnosis: Acute pain   OT Problem List: Decreased strength;Decreased range of motion;Decreased activity tolerance;Decreased knowledge of use of DME or AE;Pain   OT Treatment/Interventions:      OT Goals(Current goals can be found  in the care plan section) Acute Rehab OT Goals Patient Stated Goal: to go home OT Goal Formulation: All assessment and education complete, DC therapy  OT Frequency:     Barriers to D/C:            Co-evaluation              End of Session Equipment Utilized During Treatment: Rolling walker Nurse Communication: Other (comment) (cleared by therapy for d/c)  Activity Tolerance: Patient tolerated treatment well Patient left: in chair;with call bell/phone within  reach;with chair alarm set;with family/visitor present   Time: 1026-1056 OT Time Calculation (min): 30 min Charges:  OT General Charges $OT Visit: 1 Procedure OT Evaluation $OT Eval Low Complexity: 1 Procedure OT Treatments $Self Care/Home Management : 8-22 mins G-Codes: OT G-codes **NOT FOR INPATIENT CLASS** Functional Assessment Tool Used: clinical judgment Functional Limitation: Self care Self Care Current Status ([id number]): At least 40 percent but less than 60 percent impaired, limited or restricted Self Care Goal Status ([id number]): At least 20 percent but less than 40 percent impaired, limited or restricted Self Care Discharge Status [phone]([id number]): At least 40 percent but less than 60 percent impaired, limited or restricted  Laith Antonelli A 05/09/2016, 12:16 PM

## 2016-05-09 NOTE — Progress Notes (Signed)
Physical Therapy Treatment Patient Details Name: Kristin PaceMary A Barnett MRN: [id number] DOB: March 30, 1936 Today's Date: 05/09/2016    History of Present Illness L Uni knee replacement. R knee cortisone injection    PT Comments    POD # 1 Assisted OOB to bathroom, amb in hallway then performed TE's followed by ICE.  Pt ready for D/C to home.    Follow Up Recommendations  Home health PT;Supervision/Assistance - 24 hour     Equipment Recommendations  None recommended by PT    Recommendations for Other Services       Precautions / Restrictions Precautions Precautions: Fall;Knee Restrictions Weight Bearing Restrictions: No Other Position/Activity Restrictions: WBAT    Mobility  Bed Mobility Overal bed mobility: Needs Assistance Bed Mobility: Supine to Sit     Supine to sit: Min assist;HOB elevated     General bed mobility comments: pt usually sleeps in her lift chair  Transfers Overall transfer level: Needs assistance Equipment used: Rolling walker (2 wheeled) Transfers: Sit to/from Stand Sit to Stand: Supervision         General transfer comment: increased time  Ambulation/Gait Ambulation/Gait assistance: Supervision;Min guard Ambulation Distance (Feet): 48 Feet Assistive device: Rolling walker (2 wheeled) Gait Pattern/deviations: Step-to pattern Gait velocity: WFL   General Gait Details: cues for sequence and safety with turns   Stairs    pt has a ramp        Wheelchair Mobility    Modified Rankin (Stroke Patients Only)       Balance                                    Cognition Arousal/Alertness: Awake/alert Behavior During Therapy: WFL for tasks assessed/performed Overall Cognitive Status: Within Functional Limits for tasks assessed                      Exercises   Unl Knee Replacement TE's 10 reps B LE ankle pumps 10 reps towel squeezes 10 reps knee presses 10 reps heel slides  10 reps SAQ's 10 reps SLR's 10 reps  ABD Followed by ICE     General Comments        Pertinent Vitals/Pain Pain Assessment: 0-10 Pain Score: 3  Pain Location: L knee Pain Descriptors / Indicators: Aching;Sore Pain Intervention(s): Monitored during session;Repositioned;Ice applied    Home Living Family/patient expects to be discharged to:: Private residence Living Arrangements: Children;Non-relatives/Friends Available Help at Discharge: Family Type of Home: House Home Access: Ramped entrance   Home Layout: One level Home Equipment: Environmental consultantWalker - 2 wheels;Walker - 4 wheels;Cane - single point;Bedside commode;Shower seat;Grab bars - toilet Additional Comments: Lift chair, sleeps in it    Prior Function Level of Independence: Independent with assistive device(s)          PT Goals (current goals can now be found in the care plan section) Acute Rehab PT Goals Patient Stated Goal: to go home Progress towards PT goals: Progressing toward goals    Frequency  7X/week    PT Plan      Co-evaluation             End of Session Equipment Utilized During Treatment: Gait belt Activity Tolerance: Patient tolerated treatment well Patient left: in chair     Time: [id number] PT Time Calculation (min) (ACUTE ONLY): 42 min  Charges:  $Gait Training: 8-22 mins $Therapeutic Exercise: 8-22 mins $Therapeutic Activity: 8-22 mins  G Codes:      Rica Koyanagi  PTA [facility]  Acute  Rehab Pager      [phone]

## 2016-05-23 ENCOUNTER — Other Ambulatory Visit: Payer: Self-pay | Admitting: Orthopedic Surgery

## 2016-05-23 DIAGNOSIS — M1711 Unilateral primary osteoarthritis, right knee: Secondary | ICD-10-CM

## 2016-05-29 ENCOUNTER — Ambulatory Visit
Admission: RE | Admit: 2016-05-29 | Discharge: 2016-05-29 | Disposition: A | Discharge status: Home / Self Care | Payer: Medicare Other | Source: Ambulatory Visit | Attending: Orthopedic Surgery | Admitting: Orthopedic Surgery

## 2016-05-29 DIAGNOSIS — M1711 Unilateral primary osteoarthritis, right knee: Secondary | ICD-10-CM

## 2016-07-03 ENCOUNTER — Ambulatory Visit: Payer: Self-pay | Admitting: Orthopedic Surgery

## 2016-07-25 ENCOUNTER — Other Ambulatory Visit (HOSPITAL_COMMUNITY): Payer: Self-pay | Admitting: *Deleted

## 2016-07-25 NOTE — Patient Instructions (Addendum)
Kristin PaceMary A Barnett  07/25/2016   Your procedure is scheduled on: 08-05-16  [facility]  [facility] 3[facility] floor to  Short Stay Center at 1145 AM.  Call this number if you have problems the morning of surgery [phone]   Remember: ONLY 1 PERSON MAY GO WITH YOU TO SHORT STAY TO GET  READY MORNING OF YOUR SURGERY.  Do not eat food:After Midnight.CLEAR LIQUIDS FROM MIDNIGHT UNTIL 900 AM DAY OF SURGERY, NOTHING BY MOUTH AFTER 900 AM DAY OF SURGERY     Take these medicines the morning of surgery with A SIP OF WATER:LEVOTHYROID (SYNTHROID)              You may not have any metal on your body including hair pins and              piercings  Do not wear jewelry, make-up, lotions, powders or perfumes, deodorant             Do not wear nail polish.  Do not shave  48 hours prior to surgery.              Men may shave face and neck.   Do not bring valuables to the hospital.  IS NOT             RESPONSIBLE   FOR VALUABLES.  Contacts, dentures or bridgework may not be worn into surgery.  Leave suitcase in the car. After surgery it may be brought to your room.                  Please read over the following fact sheets you were given: _____________________________________________________________________                CLEAR LIQUID DIET   Foods Allowed                                                                     Foods Excluded  Coffee and tea, regular and decaf                             liquids that you cannot  Plain Jell-O in any flavor                                             see through such as: Fruit ices (not with fruit pulp)                                     milk, soups, orange juice  Iced Popsicles                                    All solid food Carbonated beverages, regular and diet  Cranberry, grape and apple juices Sports drinks like Gatorade Lightly seasoned  clear broth or consume(fat free) Sugar, honey syrup  Sample Menu Breakfast                                Lunch                                     Supper Cranberry juice                    Beef broth                            Chicken broth Jell-O                                     Grape juice                           Apple juice Coffee or tea                        Jell-O                                      Popsicle                                                Coffee or tea                        Coffee or tea  _____________________________________________________________________  [facility] - Preparing for Surgery Before surgery, you can play an important role.  Because skin is not sterile, your skin needs to be as free of germs as possible.  You can reduce the number of germs on your skin by washing with CHG (chlorahexidine gluconate) soap before surgery.  CHG is an antiseptic cleaner which kills germs and bonds with the skin to continue killing germs even after washing. Please DO NOT use if you have an allergy to CHG or antibacterial soaps.  If your skin becomes reddened/irritated stop using the CHG and inform your nurse when you arrive at Short Stay. Do not shave (including legs and underarms) for at least 48 hours prior to the first CHG shower.  You may shave your face/neck. Please follow these instructions carefully:  1.  Shower with CHG Soap the night before surgery and the  morning of Surgery.  2.  If you choose to wash your hair, wash your hair first as usual with your  normal  shampoo.  3.  After you shampoo, rinse your hair and body thoroughly to remove the  shampoo.                           4.  Use CHG as you would any other liquid soap.  You can apply chg directly  to the skin and wash  Gently with a scrungie or clean washcloth.  5.  Apply the CHG Soap to your body ONLY FROM THE NECK DOWN.   Do not use on face/ open                           Wound or  open sores. Avoid contact with eyes, ears mouth and genitals (private parts).                       Wash face,  Genitals (private parts) with your normal soap.             6.  Wash thoroughly, paying special attention to the area where your surgery  will be performed.  7.  Thoroughly rinse your body with warm water from the neck down.  8.  DO NOT shower/wash with your normal soap after using and rinsing off  the CHG Soap.                9.  Pat yourself dry with a clean towel.            10.  Wear clean pajamas.            11.  Place clean sheets on your bed the night of your first shower and do not  sleep with pets. Day of Surgery : Do not apply any lotions/deodorants the morning of surgery.  Please wear clean clothes to the hospital/surgery center.  FAILURE TO FOLLOW THESE INSTRUCTIONS MAY RESULT IN THE CANCELLATION OF YOUR SURGERY PATIENT SIGNATURE_________________________________  NURSE SIGNATURE__________________________________  ________________________________________________________________________   Adam Phenix  An incentive spirometer is a tool that can help keep your lungs clear and active. This tool measures how well you are filling your lungs with each breath. Taking long deep breaths may help reverse or decrease the chance of developing breathing (pulmonary) problems (especially infection) following:  A long period of time when you are unable to move or be active. BEFORE THE PROCEDURE   If the spirometer includes an indicator to show your best effort, your nurse or respiratory therapist will set it to a desired goal.  If possible, sit up straight or lean slightly forward. Try not to slouch.  Hold the incentive spirometer in an upright position. INSTRUCTIONS FOR USE  1. Sit on the edge of your bed if possible, or sit up as far as you can in bed or on a chair. 2. Hold the incentive spirometer in an upright position. 3. Breathe out normally. 4. Place the  mouthpiece in your mouth and seal your lips tightly around it. 5. Breathe in slowly and as deeply as possible, raising the piston or the ball toward the top of the column. 6. Hold your breath for 3-5 seconds or for as long as possible. Allow the piston or ball to fall to the bottom of the column. 7. Remove the mouthpiece from your mouth and breathe out normally. 8. Rest for a few seconds and repeat Steps 1 through 7 at least 10 times every 1-2 hours when you are awake. Take your time and take a few normal breaths between deep breaths. 9. The spirometer may include an indicator to show your best effort. Use the indicator as a goal to work toward during each repetition. 10. After each set of 10 deep breaths, practice coughing to be sure your lungs are clear. If you have an incision (the cut made at the time of  surgery), support your incision when coughing by placing a pillow or rolled up towels firmly against it. Once you are able to get out of bed, walk around indoors and cough well. You may stop using the incentive spirometer when instructed by your caregiver.  RISKS AND COMPLICATIONS  Take your time so you do not get dizzy or light-headed.  If you are in pain, you may need to take or ask for pain medication before doing incentive spirometry. It is harder to take a deep breath if you are having pain. AFTER USE  Rest and breathe slowly and easily.  It can be helpful to keep track of a log of your progress. Your caregiver can provide you with a simple table to help with this. If you are using the spirometer at home, follow these instructions: [place] IF:   You are having difficultly using the spirometer.  You have trouble using the spirometer as often as instructed.  Your pain medication is not giving enough relief while using the spirometer.  You develop fever of 100.5 F (38.1 C) or higher. SEEK IMMEDIATE MEDICAL CARE IF:   You cough up bloody sputum that had not been present  before.  You develop fever of 102 F (38.9 C) or greater.  You develop worsening pain at or near the incision site. MAKE SURE YOU:   Understand these instructions.  Will watch your condition.  Will get help right away if you are not doing well or get worse. Document Released: 02/24/2007 Document Revised: 01/06/2012 Document Reviewed: 04/27/2007 ExitCare Patient Information 2014 Exit[employer], [place].   ________________________________________________________________________  WHAT IS A BLOOD TRANSFUSION? Blood Transfusion Information  A transfusion is the replacement of blood or some of its parts. Blood is made up of multiple cells which provide different functions.  Red blood cells carry oxygen and are used for blood loss replacement.  White blood cells fight against infection.  Platelets control bleeding.  Plasma helps clot blood.  Other blood products are available for specialized needs, such as hemophilia or other clotting disorders. BEFORE THE TRANSFUSION  Who gives blood for transfusions?   Healthy volunteers who are fully evaluated to make sure their blood is safe. This is blood bank blood. Transfusion therapy is the safest it has ever been in the practice of medicine. Before blood is taken from a donor, a complete history is taken to make sure that person has no history of diseases nor engages in risky social behavior (examples are intravenous drug use or sexual activity with multiple partners). The donor's travel history is screened to minimize risk of transmitting infections, such as malaria. The donated blood is tested for signs of infectious diseases, such as HIV and hepatitis. The blood is then tested to be sure it is compatible with you in order to minimize the chance of a transfusion reaction. If you or a relative donates blood, this is often done in anticipation of surgery and is not appropriate for emergency situations. It takes many days to process the donated  blood. RISKS AND COMPLICATIONS Although transfusion therapy is very safe and saves many lives, the main dangers of transfusion include:   Getting an infectious disease.  Developing a transfusion reaction. This is an allergic reaction to something in the blood you were given. Every precaution is taken to prevent this. The decision to have a blood transfusion has been considered carefully by your caregiver before blood is given. Blood is not given unless the benefits outweigh the risks. AFTER THE  TRANSFUSION  Right after receiving a blood transfusion, you will usually feel much better and more energetic. This is especially true if your red blood cells have gotten low (anemic). The transfusion raises the level of the red blood cells which carry oxygen, and this usually causes an energy increase.  The nurse administering the transfusion will monitor you carefully for complications. HOME CARE INSTRUCTIONS  No special instructions are needed after a transfusion. You may find your energy is better. Speak with your caregiver about any limitations on activity for underlying diseases you may have. SEEK MEDICAL CARE IF:   Your condition is not improving after your transfusion.  You develop redness or irritation at the intravenous (IV) site. SEEK IMMEDIATE MEDICAL CARE IF:  Any of the following symptoms occur over the next 12 hours:  Shaking chills.  You have a temperature by mouth above 102 F (38.9 C), not controlled by medicine.  Chest, back, or muscle pain.  People around you feel you are not acting correctly or are confused.  Shortness of breath or difficulty breathing.  Dizziness and fainting.  You get a rash or develop hives.  You have a decrease in urine output.  Your urine turns a dark color or changes to pink, red, or brown. Any of the following symptoms occur over the next 10 days:  You have a temperature by mouth above 102 F (38.9 C), not controlled by  medicine.  Shortness of breath.  Weakness after normal activity.  The white part of the eye turns yellow (jaundice).  You have a decrease in the amount of urine or are urinating less often.  Your urine turns a dark color or changes to pink, red, or brown. Document Released: 10/11/2000 Document Revised: 01/06/2012 Document Reviewed: 05/30/2008 [facility] Patient Information 2014 [place], [place].  _______________________________________________________________________

## 2016-07-26 ENCOUNTER — Encounter (HOSPITAL_COMMUNITY)
Admission: RE | Admit: 2016-07-26 | Discharge: 2016-07-26 | Disposition: A | Discharge status: Home / Self Care | Payer: Medicare Other | Source: Ambulatory Visit | Attending: Orthopedic Surgery | Admitting: Orthopedic Surgery

## 2016-07-26 ENCOUNTER — Encounter (HOSPITAL_COMMUNITY): Payer: Self-pay

## 2016-07-26 DIAGNOSIS — Z01812 Encounter for preprocedural laboratory examination: Secondary | ICD-10-CM | POA: Diagnosis present

## 2016-07-26 LAB — COMPREHENSIVE METABOLIC PANEL
ALBUMIN: 4.5 g/dL (ref 3.5–5.0)
ALT: 26 U/L (ref 14–54)
ANION GAP: 6 (ref 5–15)
AST: 25 U/L (ref 15–41)
Alkaline Phosphatase: 101 U/L (ref 38–126)
BUN: 23 mg/dL — AB (ref 6–20)
CHLORIDE: 103 mmol/L (ref 101–111)
CO2: 30 mmol/L (ref 22–32)
Calcium: 10.4 mg/dL — ABNORMAL HIGH (ref 8.9–10.3)
Creatinine, Ser: 0.96 mg/dL (ref 0.44–1.00)
GFR calc Af Amer: >60 mL/min (ref >60)
GFR calc non Af Amer: 55 mL/min — ABNORMAL LOW (ref >60)
GLUCOSE: 213 mg/dL — AB (ref 65–99)
POTASSIUM: 4 mmol/L (ref 3.5–5.1)
SODIUM: 139 mmol/L (ref 135–145)
Total Bilirubin: 0.7 mg/dL (ref 0.3–1.2)
Total Protein: 6.8 g/dL (ref 6.5–8.1)

## 2016-07-26 LAB — CBC
HCT: 40.4 % (ref 36.0–46.0)
Hemoglobin: 13.6 g/dL (ref 12.0–15.0)
MCH: 31.1 pg (ref 26.0–34.0)
MCHC: 33.7 g/dL (ref 30.0–36.0)
MCV: 92.4 fL (ref 78.0–100.0)
PLATELETS: 198 10*3/uL (ref 150–400)
RBC: 4.37 MIL/uL (ref 3.87–5.11)
RDW: 12.5 % (ref 11.5–15.5)
WBC: 5.9 10*3/uL (ref 4.0–10.5)

## 2016-07-26 LAB — SURGICAL PCR SCREEN
MRSA, PCR: NEGATIVE
Staphylococcus aureus: NEGATIVE

## 2016-07-26 LAB — URINALYSIS, ROUTINE W REFLEX MICROSCOPIC
BILIRUBIN URINE: NEGATIVE
Glucose, UA: 100 mg/dL — AB
HGB URINE DIPSTICK: NEGATIVE
KETONES UR: NEGATIVE mg/dL
Leukocytes, UA: NEGATIVE
Nitrite: NEGATIVE
PROTEIN: NEGATIVE mg/dL
Specific Gravity, Urine: 1.007 (ref 1.005–1.030)
pH: 6 (ref 5.0–8.0)

## 2016-07-26 LAB — PROTIME-INR
INR: 0.89
Prothrombin Time: 12 seconds (ref 11.4–15.2)

## 2016-07-26 LAB — APTT: APTT: 29 s (ref 24–36)

## 2016-07-31 NOTE — Progress Notes (Signed)
[facility] 9[facility]30 am on 08/05/2016; pt verbalized understanding of no food or drink after midnight.

## 2016-08-04 ENCOUNTER — Ambulatory Visit: Payer: Self-pay | Admitting: Orthopedic Surgery

## 2016-08-04 NOTE — H&P (Signed)
Kristin Barnett DOB: 07/21/1936 Married / Language: English / Race: White Female Date of Admission:  08/05/2016 CC:  Right Knee pain History of Present Illness The patient is a 80 year old female who comes in for a preoperative History and Physical. The patient is scheduled for a right unicompartmental knee arthroplasty to be performed by Dr. Frank V. Aluisio, MD at [facility] on 08/05/2016. The patient comes in months out from left unicompartmental knee arthroplasty (with right knee injection). The patient states that she is doing well at this time. The pain is under fair control at this time and describe their pain as mild to moderate. They are currently on Tylenol (during the day) and Oxycodone (with muscle relaxant at night) for their pain. The patient is currently doing outpatient physical therapy ([facility]). The patient feels that they are progressing well at this time. She states now the right knee is really bothering her more than the left.  She said the left knee is doing great at this time. She is very pleased with her result. Her right knee is very problematic and she is eagerly anticipating getting the right knee done. They have been treated conservatively in the past for the above stated problem and despite conservative measures, they continue to have progressive pain and severe functional limitations and dysfunction. They have failed non-operative management including home exercise, medications, and injections. It is felt that they would benefit from undergoing total joint replacement. Risks and benefits of the procedure have been discussed with the patient and they elect to proceed with surgery. There are no active contraindications to surgery such as ongoing infection or rapidly progressive neurological disease.   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  Family History  Cancer  First Degree Relatives. mother, brother and grandmother mothers side Congestive Heart Failure  father Diabetes Mellitus  grandfather mothers side and grandmother fathers side Heart Disease  mother, father, brother and grandfather fathers side Hypertension  father and brother  Social History Pain Contract  no Alcohol use  never consumed alcohol Tobacco use  Never smoker. never smoker Children  3 Copy of Drug/Alcohol Rehab (Previously)  no Current work status  working full time Drug/Alcohol Rehab (Currently)  no Exercise  Exercises rarely Illicit drug use  no Marital status  Widowed. Number of flights of stairs before winded  2-3 Living situation  Lives with relatives. Son is in the house Post-Surgical Plans  Home. Does have a caregiver coming in following the Right UK[facility]  Medication History  Zegerid (40-1100MG Capsule, Oral) Active. (Alternates every other night with Nexium) NexIUM (20MG Capsule DR, Oral) Active. (Alternates every other night with Zegerid) ZyrTEC Allergy (10MG Tablet, Oral) Active. Atenolol (50MG Tablet, Oral) Active. Centrum Vitamints (Oral) Active. Vitamin D (Oral) Specific strength unknown - Active. Synthroid (50MCG Tablet, Oral) Active. Chlorthalidone (25MG Tablet, Oral) Active. Tylenol Extra Strength (500MG Tablet, Oral) Active. Aleve (220MG Capsule, 1 (one) Oral) Active.  Past Surgical History  Foot Surgery  right Arthroscopy of Shoulder  right Hysterectomy  complete  (non-cancerous) Left Unicompartmental Knee Replacement   Review of Systems General Not Present-   Chills, Fatigue, Fever, Memory Loss, Night Sweats, Weight Gain and Weight Loss. Skin Not Present- Eczema, Hives, Itching, Lesions and Rash. HEENT Not Present- Dentures, Double Vision, Headache, Hearing Loss, Tinnitus and Visual Loss. Respiratory Not Present- Allergies, Chronic Cough, Coughing up blood, Shortness of breath at rest and Shortness of breath with exertion. Cardiovascular Not Present- Chest Pain, Difficulty Breathing Lying Down, Murmur, Palpitations, Racing/skipping heartbeats and Swelling. Gastrointestinal Not Present- Abdominal Pain, Bloody Stool, Constipation, Diarrhea, Difficulty Swallowing, Heartburn, Jaundice, Loss of appetitie, Nausea and Vomiting. Female Genitourinary Not Present- Blood in Urine, Discharge, Flank Pain, Incontinence, Painful Urination, Urgency, Urinary frequency, Urinary Retention, Urinating at Night and Weak urinary stream. Musculoskeletal Present- Joint Pain. Not Present- Back Pain, Joint Swelling, Morning Stiffness, Muscle Pain, Muscle Weakness and Spasms. Neurological Not Present- Blackout spells, Difficulty with balance, Dizziness, Paralysis, Tremor and Weakness. Psychiatric Not Present- Insomnia.  Vitals  Weight: 230 lb Height: 63in Body Surface Area: 2.05 m Body Mass Index: 40.74 kg/m  Pulse: 76 (Regular)  BP: 122/64 (Sitting, Right Arm, Standard)  Physical Exam General Mental Status -Alert, cooperative and good historian. General Appearance-pleasant, Not in acute distress. Orientation-Oriented X3. Build & Nutrition-Well nourished and Well developed.  Head and Neck Head-normocephalic, atraumatic . Neck Global Assessment - supple, no bruit auscultated on the right, no bruit auscultated on the left.  Eye Pupil - Bilateral-Regular and Round. Motion - Bilateral-EOMI.  Chest and Lung Exam Auscultation Breath sounds -  clear at anterior chest wall and clear at posterior chest wall. Adventitious sounds - No Adventitious sounds.  Cardiovascular Auscultation Rhythm - Regular rate and rhythm. Heart Sounds - S1 WNL and S2 WNL. Murmurs & Other Heart Sounds - Auscultation of the heart reveals - No Murmurs.  Abdomen Inspection Contour - Generalized moderate distention. Palpation/Percussion Tenderness - Abdomen is non-tender to palpation. Rigidity (guarding) - Abdomen is soft. Auscultation Auscultation of the abdomen reveals - Bowel sounds normal.  Female Genitourinary Note: Not done, not pertinent to present illness   Musculoskeletal Note: Evaluation of her hip show normal range of motion with discomfort. Right knee range 0 to 130, slight crepitus on range of motion. Tender medial greater than lateral, no instability noted. Pulse, sensation, and motor intact distally.  RADIOGRAPHS Right knee has narrowing but not fully bone on bone.   Assessment & Plan  Status post unicompartmental knee replacement, left (Z96.652) Primary osteoarthritis of right knee (M17.11)  Note:Surgical Plans: Right Unicompartmental Knee Replacement  Disposition: Home  PCP: Dr. Hoffman  IV TXA  Anesthesia Issues: None  Signed electronically by Alezandrew L Darwin Guastella, III PA-C  

## 2016-08-05 ENCOUNTER — Ambulatory Visit (HOSPITAL_COMMUNITY): Payer: Medicare Other | Admitting: Anesthesiology

## 2016-08-05 ENCOUNTER — Observation Stay (HOSPITAL_COMMUNITY)
Admission: RE | Admit: 2016-08-05 | Discharge: 2016-08-06 | Disposition: A | Discharge status: Home / Self Care | Payer: Medicare Other | Source: Ambulatory Visit | Attending: Orthopedic Surgery | Admitting: Orthopedic Surgery

## 2016-08-05 ENCOUNTER — Encounter (HOSPITAL_COMMUNITY)
Admission: RE | Disposition: A | Discharge status: Home / Self Care | Payer: Self-pay | Source: Ambulatory Visit | Attending: Orthopedic Surgery

## 2016-08-05 ENCOUNTER — Encounter (HOSPITAL_COMMUNITY): Payer: Self-pay | Admitting: *Deleted

## 2016-08-05 DIAGNOSIS — I10 Essential (primary) hypertension: Secondary | ICD-10-CM | POA: Insufficient documentation

## 2016-08-05 DIAGNOSIS — K219 Gastro-esophageal reflux disease without esophagitis: Secondary | ICD-10-CM | POA: Insufficient documentation

## 2016-08-05 DIAGNOSIS — M179 Osteoarthritis of knee, unspecified: Secondary | ICD-10-CM | POA: Diagnosis present

## 2016-08-05 DIAGNOSIS — E039 Hypothyroidism, unspecified: Secondary | ICD-10-CM | POA: Diagnosis not present

## 2016-08-05 DIAGNOSIS — Z96653 Presence of artificial knee joint, bilateral: Secondary | ICD-10-CM | POA: Diagnosis not present

## 2016-08-05 DIAGNOSIS — Z6841 Body Mass Index (BMI) 40.0 and over, adult: Secondary | ICD-10-CM | POA: Insufficient documentation

## 2016-08-05 DIAGNOSIS — Z880 Allergy status to penicillin: Secondary | ICD-10-CM | POA: Diagnosis not present

## 2016-08-05 DIAGNOSIS — Z7901 Long term (current) use of anticoagulants: Secondary | ICD-10-CM | POA: Insufficient documentation

## 2016-08-05 DIAGNOSIS — M1711 Unilateral primary osteoarthritis, right knee: Secondary | ICD-10-CM

## 2016-08-05 DIAGNOSIS — M170 Bilateral primary osteoarthritis of knee: Secondary | ICD-10-CM | POA: Diagnosis not present

## 2016-08-05 DIAGNOSIS — M1710 Unilateral primary osteoarthritis, unspecified knee: Secondary | ICD-10-CM | POA: Diagnosis present

## 2016-08-05 HISTORY — PX: PARTIAL KNEE ARTHROPLASTY: SHX2174

## 2016-08-05 LAB — TYPE AND SCREEN
ABO/RH(D): O POS
ANTIBODY SCREEN: NEGATIVE

## 2016-08-05 SURGERY — ARTHROPLASTY, KNEE, UNICOMPARTMENTAL
Anesthesia: Spinal | Site: Knee | Laterality: Right

## 2016-08-05 MED ORDER — BUPIVACAINE HCL (PF) 0.25 % IJ SOLN
INTRAMUSCULAR | Status: DC | PRN
  Administered 2016-08-05: 30 mL

## 2016-08-05 MED ORDER — ONDANSETRON HCL 4 MG/2ML IJ SOLN
INTRAMUSCULAR | Status: AC
  Filled 2016-08-05: qty 2

## 2016-08-05 MED ORDER — PANTOPRAZOLE SODIUM 40 MG PO TBEC
40.0000 mg | DELAYED_RELEASE_TABLET | Freq: Every day | ORAL | Status: DC
Start: 2016-08-05 — End: 2016-08-06
  Administered 2016-08-05: 40 mg via ORAL
  Filled 2016-08-05: qty 1

## 2016-08-05 MED ORDER — CEFAZOLIN SODIUM-DEXTROSE 2-4 GM/100ML-% IV SOLN
INTRAVENOUS | Status: AC
  Filled 2016-08-05: qty 100

## 2016-08-05 MED ORDER — ACETAMINOPHEN 650 MG RE SUPP: 650.0000 mg | Freq: Four times a day (QID) | RECTAL | Status: DC | PRN

## 2016-08-05 MED ORDER — BUPIVACAINE HCL (PF) 0.75 % IJ SOLN
INTRAMUSCULAR | Status: DC | PRN
  Administered 2016-08-05: 15 mg via INTRATHECAL

## 2016-08-05 MED ORDER — ACETAMINOPHEN 10 MG/ML IV SOLN
1000.0000 mg | Freq: Once | INTRAVENOUS | Status: AC
  Administered 2016-08-05: 1000 mg via INTRAVENOUS
  Filled 2016-08-05: qty 100

## 2016-08-05 MED ORDER — DEXAMETHASONE SODIUM PHOSPHATE 10 MG/ML IJ SOLN
INTRAMUSCULAR | Status: AC
  Filled 2016-08-05: qty 1

## 2016-08-05 MED ORDER — ONDANSETRON HCL 4 MG PO TABS: 4.0000 mg | ORAL_TABLET | Freq: Four times a day (QID) | ORAL | Status: DC | PRN

## 2016-08-05 MED ORDER — DIPHENHYDRAMINE HCL 12.5 MG/5ML PO ELIX: 12.5000 mg | ORAL_SOLUTION | ORAL | Status: DC | PRN

## 2016-08-05 MED ORDER — FENTANYL CITRATE (PF) 100 MCG/2ML IJ SOLN
INTRAMUSCULAR | Status: AC
  Administered 2016-08-05: 50 ug via INTRAVENOUS
  Filled 2016-08-05: qty 2

## 2016-08-05 MED ORDER — RIVAROXABAN 10 MG PO TABS
10.0000 mg | ORAL_TABLET | Freq: Every day | ORAL | Status: DC
  Administered 2016-08-06: 10 mg via ORAL
  Filled 2016-08-05: qty 1

## 2016-08-05 MED ORDER — MORPHINE SULFATE (PF) 2 MG/ML IV SOLN: 1.0000 mg | INTRAVENOUS | Status: DC | PRN

## 2016-08-05 MED ORDER — CEFAZOLIN SODIUM-DEXTROSE 2-4 GM/100ML-% IV SOLN
2.0000 g | INTRAVENOUS | Status: AC
  Administered 2016-08-05: 2 g via INTRAVENOUS
  Filled 2016-08-05: qty 100

## 2016-08-05 MED ORDER — PROPOFOL 10 MG/ML IV BOLUS
INTRAVENOUS | Status: AC
  Filled 2016-08-05: qty 40

## 2016-08-05 MED ORDER — FENTANYL CITRATE (PF) 100 MCG/2ML IJ SOLN
25.0000 ug | INTRAMUSCULAR | Status: DC | PRN
  Administered 2016-08-05: 50 ug via INTRAVENOUS
  Administered 2016-08-05: 25 ug via INTRAVENOUS

## 2016-08-05 MED ORDER — SODIUM CHLORIDE 0.9 % IV SOLN
INTRAVENOUS | Status: DC
  Administered 2016-08-05: 17:00:00 via INTRAVENOUS

## 2016-08-05 MED ORDER — ACETAMINOPHEN 10 MG/ML IV SOLN
INTRAVENOUS | Status: AC
  Filled 2016-08-05: qty 100

## 2016-08-05 MED ORDER — METOCLOPRAMIDE HCL 5 MG PO TABS: 5.0000 mg | ORAL_TABLET | Freq: Three times a day (TID) | ORAL | Status: DC | PRN

## 2016-08-05 MED ORDER — BUPIVACAINE LIPOSOME 1.3 % IJ SUSP
20.0000 mL | Freq: Once | INTRAMUSCULAR | Status: DC
  Filled 2016-08-05: qty 20

## 2016-08-05 MED ORDER — CHLORTHALIDONE 25 MG PO TABS
25.0000 mg | ORAL_TABLET | Freq: Every day | ORAL | Status: DC
  Administered 2016-08-06: 25 mg via ORAL
  Filled 2016-08-05: qty 1

## 2016-08-05 MED ORDER — METOCLOPRAMIDE HCL 5 MG/ML IJ SOLN: 5.0000 mg | Freq: Three times a day (TID) | INTRAMUSCULAR | Status: DC | PRN

## 2016-08-05 MED ORDER — ONDANSETRON HCL 4 MG/2ML IJ SOLN: 4.0000 mg | Freq: Four times a day (QID) | INTRAMUSCULAR | Status: DC | PRN

## 2016-08-05 MED ORDER — LACTATED RINGERS IV SOLN
INTRAVENOUS | Status: DC
  Administered 2016-08-05: 1000 mL via INTRAVENOUS

## 2016-08-05 MED ORDER — 0.9 % SODIUM CHLORIDE (POUR BTL) OPTIME
TOPICAL | Status: DC | PRN
  Administered 2016-08-05: 1000 mL

## 2016-08-05 MED ORDER — SODIUM CHLORIDE 0.9 % IJ SOLN
INTRAMUSCULAR | Status: DC | PRN
  Administered 2016-08-05: 30 mL

## 2016-08-05 MED ORDER — SODIUM CHLORIDE 0.9 % IR SOLN
Status: DC | PRN
  Administered 2016-08-05: 1000 mL

## 2016-08-05 MED ORDER — CEFAZOLIN SODIUM-DEXTROSE 2-4 GM/100ML-% IV SOLN
2.0000 g | Freq: Four times a day (QID) | INTRAVENOUS | Status: AC
  Administered 2016-08-05 – 2016-08-06 (×2): 2 g via INTRAVENOUS
  Filled 2016-08-05 (×2): qty 100

## 2016-08-05 MED ORDER — BISACODYL 10 MG RE SUPP: 10.0000 mg | Freq: Every day | RECTAL | Status: DC | PRN

## 2016-08-05 MED ORDER — PROMETHAZINE HCL 25 MG/ML IJ SOLN: 6.2500 mg | INTRAMUSCULAR | Status: DC | PRN

## 2016-08-05 MED ORDER — PHENYLEPHRINE 40 MCG/ML (10ML) SYRINGE FOR IV PUSH (FOR BLOOD PRESSURE SUPPORT)
PREFILLED_SYRINGE | INTRAVENOUS | Status: AC
  Filled 2016-08-05: qty 10

## 2016-08-05 MED ORDER — METHOCARBAMOL 1000 MG/10ML IJ SOLN
500.0000 mg | Freq: Four times a day (QID) | INTRAVENOUS | Status: DC | PRN
  Administered 2016-08-05: 500 mg via INTRAVENOUS
  Filled 2016-08-05: qty 550
  Filled 2016-08-05: qty 5

## 2016-08-05 MED ORDER — METHOCARBAMOL 500 MG PO TABS
500.0000 mg | ORAL_TABLET | Freq: Four times a day (QID) | ORAL | Status: DC | PRN
  Administered 2016-08-05 – 2016-08-06 (×2): 500 mg via ORAL
  Filled 2016-08-05 (×2): qty 1

## 2016-08-05 MED ORDER — DOCUSATE SODIUM 100 MG PO CAPS
100.0000 mg | ORAL_CAPSULE | Freq: Two times a day (BID) | ORAL | Status: DC
  Administered 2016-08-05 – 2016-08-06 (×2): 100 mg via ORAL
  Filled 2016-08-05 (×2): qty 1

## 2016-08-05 MED ORDER — PHENYLEPHRINE HCL 10 MG/ML IJ SOLN
INTRAMUSCULAR | Status: DC | PRN
  Administered 2016-08-05: 40 ug via INTRAVENOUS
  Administered 2016-08-05: 60 ug via INTRAVENOUS

## 2016-08-05 MED ORDER — PHENOL 1.4 % MT LIQD: 1.0000 | OROMUCOSAL | Status: DC | PRN

## 2016-08-05 MED ORDER — LEVOTHYROXINE SODIUM 50 MCG PO TABS
50.0000 ug | ORAL_TABLET | Freq: Every day | ORAL | Status: DC
  Administered 2016-08-06: 50 ug via ORAL
  Filled 2016-08-05: qty 1

## 2016-08-05 MED ORDER — FENTANYL CITRATE (PF) 100 MCG/2ML IJ SOLN
INTRAMUSCULAR | Status: DC | PRN
  Administered 2016-08-05 (×2): 50 ug via INTRAVENOUS

## 2016-08-05 MED ORDER — PRAVASTATIN SODIUM 20 MG PO TABS
10.0000 mg | ORAL_TABLET | Freq: Every day | ORAL | Status: DC
  Administered 2016-08-05: 10 mg via ORAL
  Filled 2016-08-05: qty 1

## 2016-08-05 MED ORDER — LACTATED RINGERS IV SOLN: INTRAVENOUS | Status: DC

## 2016-08-05 MED ORDER — OXYCODONE HCL 5 MG PO TABS
5.0000 mg | ORAL_TABLET | ORAL | Status: DC | PRN
  Administered 2016-08-05: 5 mg via ORAL
  Administered 2016-08-05: 10 mg via ORAL
  Administered 2016-08-05 – 2016-08-06 (×2): 5 mg via ORAL
  Administered 2016-08-06: 10 mg via ORAL
  Filled 2016-08-05 (×2): qty 1
  Filled 2016-08-05: qty 2
  Filled 2016-08-05: qty 1
  Filled 2016-08-05: qty 2

## 2016-08-05 MED ORDER — BUPIVACAINE LIPOSOME 1.3 % IJ SUSP
INTRAMUSCULAR | Status: DC | PRN
  Administered 2016-08-05: 20 mL

## 2016-08-05 MED ORDER — FLEET ENEMA 7-19 GM/118ML RE ENEM: 1.0000 | ENEMA | Freq: Once | RECTAL | Status: DC | PRN

## 2016-08-05 MED ORDER — TRAMADOL HCL 50 MG PO TABS: 50.0000 mg | ORAL_TABLET | Freq: Four times a day (QID) | ORAL | Status: DC | PRN

## 2016-08-05 MED ORDER — DEXAMETHASONE SODIUM PHOSPHATE 10 MG/ML IJ SOLN
10.0000 mg | Freq: Once | INTRAMUSCULAR | Status: AC
  Administered 2016-08-06: 10 mg via INTRAVENOUS
  Filled 2016-08-05: qty 1

## 2016-08-05 MED ORDER — TRANEXAMIC ACID 1000 MG/10ML IV SOLN
1000.0000 mg | Freq: Once | INTRAVENOUS | Status: AC
  Administered 2016-08-05: 1000 mg via INTRAVENOUS
  Filled 2016-08-05: qty 10

## 2016-08-05 MED ORDER — POLYETHYLENE GLYCOL 3350 17 G PO PACK: 17.0000 g | PACK | Freq: Every day | ORAL | Status: DC | PRN

## 2016-08-05 MED ORDER — ACETAMINOPHEN 500 MG PO TABS
1000.0000 mg | ORAL_TABLET | Freq: Four times a day (QID) | ORAL | Status: DC
  Administered 2016-08-05 – 2016-08-06 (×3): 1000 mg via ORAL
  Filled 2016-08-05 (×3): qty 2

## 2016-08-05 MED ORDER — PROPOFOL 500 MG/50ML IV EMUL
INTRAVENOUS | Status: DC | PRN
  Administered 2016-08-05: 100 ug/kg/min via INTRAVENOUS

## 2016-08-05 MED ORDER — FENTANYL CITRATE (PF) 100 MCG/2ML IJ SOLN
INTRAMUSCULAR | Status: AC
Start: 2016-08-05 — End: 2016-08-05
  Filled 2016-08-05: qty 2

## 2016-08-05 MED ORDER — ONDANSETRON HCL 4 MG/2ML IJ SOLN
INTRAMUSCULAR | Status: DC | PRN
  Administered 2016-08-05: 4 mg via INTRAVENOUS

## 2016-08-05 MED ORDER — ATENOLOL 25 MG PO TABS
50.0000 mg | ORAL_TABLET | Freq: Every day | ORAL | Status: DC
  Administered 2016-08-05: 50 mg via ORAL
  Filled 2016-08-05: qty 2

## 2016-08-05 MED ORDER — SODIUM CHLORIDE 0.9 % IJ SOLN
INTRAMUSCULAR | Status: AC
  Filled 2016-08-05: qty 50

## 2016-08-05 MED ORDER — ACETAMINOPHEN 325 MG PO TABS: 650.0000 mg | ORAL_TABLET | Freq: Four times a day (QID) | ORAL | Status: DC | PRN

## 2016-08-05 MED ORDER — MENTHOL 3 MG MT LOZG: 1.0000 | LOZENGE | OROMUCOSAL | Status: DC | PRN

## 2016-08-05 MED ORDER — BUPIVACAINE HCL (PF) 0.25 % IJ SOLN
INTRAMUSCULAR | Status: AC
  Filled 2016-08-05: qty 30

## 2016-08-05 MED ORDER — TRANEXAMIC ACID 1000 MG/10ML IV SOLN
1000.0000 mg | INTRAVENOUS | Status: AC
  Administered 2016-08-05: 1000 mg via INTRAVENOUS
  Filled 2016-08-05: qty 10

## 2016-08-05 MED ORDER — DEXAMETHASONE SODIUM PHOSPHATE 10 MG/ML IJ SOLN
10.0000 mg | Freq: Once | INTRAMUSCULAR | Status: AC
  Administered 2016-08-05: 10 mg via INTRAVENOUS

## 2016-08-05 SURGICAL SUPPLY — 45 items
BAG DECANTER FOR FLEXI CONT (MISCELLANEOUS) ×3 IMPLANT
BAG ZIPLOCK 12X15 (MISCELLANEOUS) IMPLANT
BANDAGE ACE 6X5 VEL STRL LF (GAUZE/BANDAGES/DRESSINGS) ×3 IMPLANT
BLADE SAW RECIPROCATING 77.5 (BLADE) ×3 IMPLANT
BLADE SAW SGTL 13.0X1.19X90.0M (BLADE) ×3 IMPLANT
BOWL SMART MIX CTS (DISPOSABLE) ×3 IMPLANT
BUR OVAL CARBIDE 4.0 (BURR) ×3 IMPLANT
CAPT KNEE PARTIAL 2 ×2 IMPLANT
CEMENT HV SMART SET (Cement) ×3 IMPLANT
CLOSURE WOUND 1/2 X4 (GAUZE/BANDAGES/DRESSINGS) ×1
CLOTH BEACON ORANGE TIMEOUT ST (SAFETY) ×3 IMPLANT
CUFF TOURN SGL QUICK 34 (TOURNIQUET CUFF) ×2
CUFF TRNQT CYL 34X4X40X1 (TOURNIQUET CUFF) ×1 IMPLANT
DRSG ADAPTIC 3X8 NADH LF (GAUZE/BANDAGES/DRESSINGS) ×3 IMPLANT
DRSG PAD ABDOMINAL 8X10 ST (GAUZE/BANDAGES/DRESSINGS) ×3 IMPLANT
DURAPREP 26ML APPLICATOR (WOUND CARE) ×3 IMPLANT
ELECT REM PT RETURN 9FT ADLT (ELECTROSURGICAL) ×3
ELECTRODE REM PT RTRN 9FT ADLT (ELECTROSURGICAL) ×1 IMPLANT
EVACUATOR 1/8 PVC DRAIN (DRAIN) ×3 IMPLANT
GAUZE SPONGE 4X4 12PLY STRL (GAUZE/BANDAGES/DRESSINGS) ×3 IMPLANT
GLOVE BIO SURGEON STRL SZ7.5 (GLOVE) ×3 IMPLANT
GLOVE BIO SURGEON STRL SZ8 (GLOVE) ×3 IMPLANT
GLOVE BIOGEL PI IND STRL 7.0 (GLOVE) ×4 IMPLANT
GLOVE BIOGEL PI IND STRL 8 (GLOVE) ×2 IMPLANT
GLOVE BIOGEL PI INDICATOR 7.0 (GLOVE) ×8
GLOVE BIOGEL PI INDICATOR 8 (GLOVE) ×4
GOWN STRL REUS W/TWL LRG LVL3 (GOWN DISPOSABLE) ×3 IMPLANT
GOWN STRL REUS W/TWL XL LVL3 (GOWN DISPOSABLE) ×3 IMPLANT
HANDPIECE INTERPULSE COAX TIP (DISPOSABLE) ×2
IMMOBILIZER KNEE 20 (SOFTGOODS) ×3
IMMOBILIZER KNEE 20 THIGH 36 (SOFTGOODS) ×1 IMPLANT
KIT IMPL STRL TIB IPOLY IUNI ×1 IMPLANT
MANIFOLD NEPTUNE II (INSTRUMENTS) ×3 IMPLANT
PACK TOTAL KNEE CUSTOM (KITS) ×3 IMPLANT
PADDING CAST COTTON 6X4 STRL (CAST SUPPLIES) ×3 IMPLANT
POSITIONER SURGICAL ARM (MISCELLANEOUS) ×3 IMPLANT
SET HNDPC FAN SPRY TIP SCT (DISPOSABLE) ×1 IMPLANT
STRIP CLOSURE SKIN 1/2X4 (GAUZE/BANDAGES/DRESSINGS) ×2 IMPLANT
SUT MNCRL AB 4-0 PS2 18 (SUTURE) ×3 IMPLANT
SUT PDS AB 1 CT1 27 (SUTURE) ×3 IMPLANT
SUT VIC AB 2-0 CT1 27 (SUTURE) ×4
SUT VIC AB 2-0 CT1 TAPERPNT 27 (SUTURE) ×2 IMPLANT
SUT VLOC 180 0 24IN GS25 (SUTURE) ×3 IMPLANT
SYR 50ML LL SCALE MARK (SYRINGE) ×3 IMPLANT
WRAP KNEE MAXI GEL POST OP (GAUZE/BANDAGES/DRESSINGS) ×3 IMPLANT

## 2016-08-05 NOTE — Interval H&P Note (Signed)
History and Physical Interval Note:  08/05/2016 11:36 AM  Kristin Barnett  has presented today for surgery, with the diagnosis of RIGHT KNEE MEDIAL COMPARTMENTAL  ARTHRITIS  The various methods of treatment have been discussed with the patient and family. After consideration of risks, benefits and other options for treatment, the patient has consented to  Procedure(s): RIGHT UNICOMPARTMENTAL  ARTHROPLASTY (Right) as a surgical intervention .  The patient's history has been reviewed, patient examined, no change in status, stable for surgery.  I have reviewed the patient's chart and labs.  Questions were answered to the patient's satisfaction.     Loanne DrillingALUISIO,[name]

## 2016-08-05 NOTE — Op Note (Signed)
OPERATIVE REPORT-UNICOMPARTMENTAL ARTHROPLASTY  PREOPERATIVE DIAGNOSIS: Medial compartment osteoarthritis, Right knee  POSTOPERATIVE DIAGNOSIS: Medial compartment osteoarthritis, Right knee  PROCEDURE:Right knee medial unicompartmental arthroplasty.   SURGEON: Ollen Gross, MD   ASSISTANT: Avel Peace, PA-C  ANESTHESIA:  Spinal.   ESTIMATED BLOOD LOSS: Minimal.   DRAINS: Hemovac x1.   TOURNIQUET TIME:   Total Tourniquet Time Documented: Thigh (Left) - 27 minutes Total: Thigh (Left) - 27 minutes    COMPLICATIONS: None.   CONDITION: Stable to recovery.   BRIEF CLINICAL NOTE:Kristin Barnett is a 80 y.o. female, who has  significant isolated medial compartment arthritis of the Right knee. The patient has had nonoperative management including injections of cortisone and viscous supplements. Unfortunately, the pain persists.  Radiograph showed isolated medial compartment bone-on-bone arthritis  with normal-appearing patellofemoral and lateral compartments. The patient presents now for left knee unicompartmental arthroplasty.   PROCEDURE IN DETAIL: After successful administration of  Spinal anesthetic, a tourniquet was placed high on the  Right thigh and the Right lower extremity prepped and draped in usual sterile fashion. Extremity was wrapped in an Esmarch, knee flexed, and tourniquet inflated to 300 mmHg.       A midline incision was made with a 10 blade through subcutaneous  tissue to the extensor mechanism. A fresh blade was used to make a  medial parapatellar arthrotomy. Soft tissue on the proximal medial  tibia subperiosteally elevated to the joint line with a knife and into  the semimembranosus bursa with a Cobb elevator. The patella was  subluxed laterally, and the knee flexed 90 degrees. The ACL was intact.  The marginal osteophytes on the medial femur and tibia were removed with  a rongeur. The medial meniscus was also removed. The femoral cutting  block  for the conformis unicompartmental knee system was placed along  the femur. There was excellent fit. I traced the outline. We then  removed any remaining cartilage within this outline. We then placed the  cutting block again and pinned in position. The posterior femoral cut  was made, it was approximately 5 mm. The lug holes for the femoral  component were then drilled through the cutting block. The cutting  block was subsequently removed. We then utilized the high speed burr to  create a small trough at the superior aspect of the component tomake it inset and would not overhang the cartilage. The trial was placed,  it had excellent fit. The trial was subsequently removed.       The trial was placed again and the B chip was placed. There was  excellent balance throughout full motion. Also with excellent fit on  the tibia. This was removed as was the femoral trial. A curette was  used to remove any remaining cartilage from the tibia. The tibial  cutting block was then placed and there was a perfect fit on the tibial  surface. The appropriate slope was placed and it was pinned in  position. The reciprocating saw was used to make the central cut and  then the oscillating saw used to make the horizontal cut. The bone  fragment was then removed. The tibial trial was placed and had perfect  fit on the tibia. We then drilled the 2 lug holes and did the keel punch.  We then placed tibia trial and femur trial, and a 6 mm trial insert. There was  excellent stability throughout full range of motion and no impingement.  The trial was then removed. We drilled small holes in  the distal  femur in order to create more conduits for the cement. The cut bone  surfaces were thoroughly irrigated with pulsatile lavage while the  cement was mixed on the back table. We then cemented the tibial  component into place, impacted it and removed the extruded cement. The  same was done for the femoral component. Trial  6-mm inserts placed,  knee held in full extension, and all extruded cement removed. While the  cement was hardening, I injected the extensor mechanism, periosteum of  the femur and subcu tissues, a total of 20 mL of Exparel mixed with 30  mL of saline and then did an additional injection of 20 mL of 0.25%  Marcaine into the same tissues. When the cement had fully hardened,  then the permanent polyethylene was placed in tibial tray. There was  excellent stability throughout full range of motion with no lift off the  component and no evidence of any impingement.       Wound was copiously irrigated with saline solution, and the arthrotomy closed over a Hemovac drain with a running #1 V-Loc suture. The subcutaneous was closed with  interrupted 2-0 Vicryl and subcuticular running 4-0 Monocryl. The drain  was hooked to suction. Incision cleaned and dried and Steri-Strips and  a bulky sterile dressing applied. The tourniquet was released after a  total time of 27 minutes. This was done after closing the extensor  mechanism. The wound was closed and a bulky sterile dressing was  applied. The operative limb was placed into a knee immobilizer, and the patient awakened and transported to recovery room in stable condition.       Please note that a surgical assistant was a medical necessity for this  procedure in order to perform it in a safe and expeditious manner.  Assistance was necessary for retracting vital ligaments, neurovascular  structures, as well as for proper positioning of the limb to allow for  appropriate bone cuts and appropriate placement of the prosthesis.    Gus RankinFrank V. Lejend Dalby, MD

## 2016-08-05 NOTE — Transfer of Care (Signed)
Immediate Anesthesia Transfer of Care Note  Patient: Kristin Barnett  Procedure(s) Performed: Procedure(s): RIGHT UNICOMPARTMENTAL  ARTHROPLASTY (Right)  Patient Location: PACU  Anesthesia Type:Spinal  Level of Consciousness: awake, alert  and oriented  Airway & Oxygen Therapy: Patient Spontanous Breathing and Patient connected to face mask oxygen  Post-op Assessment: Report given to RN and Post -op Vital signs reviewed and stable  Post vital signs: Reviewed and stable  Last Vitals:  Vitals:   08/05/16 0929  BP: (!) 158/84  Pulse: 71  Resp: 18  Temp: 36.9 C    Last Pain:  Vitals:   08/05/16 0929  TempSrc: Oral      Patients Stated Pain Goal: 4 (08/05/16 1146)  Complications: No apparent anesthesia complications

## 2016-08-05 NOTE — Anesthesia Preprocedure Evaluation (Signed)
Anesthesia Evaluation  Patient identified by MRN, date of birth, ID band Patient awake    Reviewed: Allergy & Precautions, NPO status , Patient's Chart, lab work & pertinent test results  Airway Mallampati: II  TM Distance: >3 FB Neck ROM: Full    Dental no notable dental hx.    Pulmonary neg pulmonary ROS,    Pulmonary exam normal breath sounds clear to auscultation       Cardiovascular hypertension, Pt. on medications negative cardio ROS Normal cardiovascular exam Rhythm:Regular Rate:Normal     Neuro/Psych negative neurological ROS  negative psych ROS   GI/Hepatic Neg liver ROS, GERD  Medicated,  Endo/Other  Hypothyroidism Morbid obesity  Renal/GU negative Renal ROS  negative genitourinary   Musculoskeletal negative musculoskeletal ROS (+)   Abdominal   Peds negative pediatric ROS (+)  Hematology negative hematology ROS (+)   Anesthesia Other Findings   Reproductive/Obstetrics negative OB ROS                             Anesthesia Physical  Anesthesia Plan  ASA: III  Anesthesia Plan: Spinal   Post-op Pain Management:    Induction:   Airway Management Planned: Simple Face Mask  Additional Equipment:   Intra-op Plan:   Post-operative Plan:   Informed Consent: I have reviewed the patients History and Physical, chart, labs and discussed the procedure including the risks, benefits and alternatives for the proposed anesthesia with the patient or authorized representative who has indicated his/her understanding and acceptance.   Dental advisory given  Plan Discussed with: CRNA  Anesthesia Plan Comments:         Anesthesia Quick Evaluation

## 2016-08-05 NOTE — Anesthesia Procedure Notes (Signed)
Spinal  Patient location during procedure: OR Start time: 08/05/2016 12:34 PM End time: 08/05/2016 12:37 PM Staffing Resident/CRNA: Harle Stanford R Performed: resident/CRNA  Preanesthetic Checklist Completed: patient identified, site marked, surgical consent, pre-op evaluation, timeout performed, IV checked, risks and benefits discussed and monitors and equipment checked Spinal Block Patient position: sitting Prep: Betadine Patient monitoring: heart rate, cardiac monitor, continuous pulse ox and blood pressure Approach: midline Location: L3-4 Injection technique: single-shot Needle Needle gauge: 22 G Needle length: 9 cm Needle insertion depth: 8 cm Assessment Sensory level: T6 Additional Notes Timeout performed. SAB kit date checked. SAB without difficulty

## 2016-08-05 NOTE — H&P (View-Only) (Signed)
Kristin PaceMary A Barnett DOB: 01/29/1936 Married / Language: English / Race: White Female Date of Admission:  08/05/2016 CC:  Right Knee pain History of Present Illness The patient is a 80 year old female who comes in for a preoperative History and Physical. The patient is scheduled for a right unicompartmental knee arthroplasty to be performed by Dr. Gus RankinFrank V. Aluisio, MD at [facility] on 08/05/2016. The patient comes in months out from left unicompartmental knee arthroplasty (with right knee injection). The patient states that she is doing well at this time. The pain is under fair control at this time and describe their pain as mild to moderate. They are currently on Tylenol (during the day) and Oxycodone (with muscle relaxant at night) for their pain. The patient is currently doing outpatient physical therapy [facility]([place] [facility] The patient feels that they are progressing well at this time. She states now the right knee is really bothering her more than the left.  She said the left knee is doing great at this time. She is very pleased with her result. Her right knee is very problematic and she is eagerly anticipating getting the right knee done. They have been treated conservatively in the past for the above stated problem and despite conservative measures, they continue to have progressive pain and severe functional limitations and dysfunction. They have failed non-operative management including home exercise, medications, and injections. It is felt that they would benefit from undergoing total joint replacement. Risks and benefits of the procedure have been discussed with the patient and they elect to proceed with surgery. There are no active contraindications to surgery such as ongoing infection or rapidly progressive neurological disease.   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  Family History  Cancer  First Degree Relatives. mother, brother and grandmother mothers side Congestive Heart Failure  father Diabetes Mellitus  grandfather mothers side and grandmother fathers side Heart Disease  mother, father, brother and grandfather fathers side Hypertension  father and brother  Social History Pain Contract  no Alcohol use  never consumed alcohol Tobacco use  Never smoker. never smoker Children  3 Copy of Drug/Alcohol Rehab (Previously)  no Current work status  working full time Drug/Alcohol Rehab (Currently)  no Exercise  Exercises rarely Illicit drug use  no Marital status  Widowed. Number of flights of stairs before winded  2-3 Living situation  Lives with relatives. Son is in the house Post-Surgical Plans  Home. Does have a caregiver coming in following the Right UKA  Medication History  Zegerid (40-1100MG  Capsule, Oral) Active. (Alternates every other night with Nexium) NexIUM (20MG  Capsule DR, Oral) Active. (Alternates every other night with Zegerid) ZyrTEC Allergy (10MG  Tablet, Oral) Active. Atenolol (50MG  Tablet, Oral) Active. Centrum Vitamints (Oral) Active. Vitamin D (Oral) Specific strength unknown - Active. Synthroid (50MCG Tablet, Oral) Active. Chlorthalidone (25MG  Tablet, Oral) Active. Tylenol Extra Strength (500MG  Tablet, Oral) Active. Aleve (220MG  Capsule, 1 (one) Oral) Active.  Past Surgical History  Foot Surgery  right Arthroscopy of Shoulder  right Hysterectomy  complete  (non-cancerous) Left Unicompartmental Knee Replacement   Review of Systems General Not Present-  Chills, Fatigue, Fever, Memory Loss, Night Sweats, Weight Gain and Weight Loss. Skin Not Present- Eczema, Hives, Itching, Lesions and Rash. HEENT Not Present- Dentures, Double Vision, Headache, Hearing Loss, Tinnitus and Visual Loss. Respiratory Not Present- Allergies, Chronic Cough, Coughing up blood, Shortness of breath at rest and Shortness of breath with exertion. Cardiovascular Not Present- Chest Pain, Difficulty Breathing Lying Down, Murmur, Palpitations, Racing/skipping heartbeats and Swelling. Gastrointestinal Not Present- Abdominal Pain, Bloody Stool, Constipation, Diarrhea, Difficulty Swallowing, Heartburn, Jaundice, Loss of appetitie, Nausea and Vomiting. Female Genitourinary Not Present- Blood in Urine, Discharge, Flank Pain, Incontinence, Painful Urination, Urgency, Urinary frequency, Urinary Retention, Urinating at Night and Weak urinary stream. Musculoskeletal Present- Joint Pain. Not Present- Back Pain, Joint Swelling, Morning Stiffness, Muscle Pain, Muscle Weakness and Spasms. Neurological Not Present- Blackout spells, Difficulty with balance, Dizziness, Paralysis, Tremor and Weakness. Psychiatric Not Present- Insomnia.  Vitals  Weight: 230 lb Height: 63in Body Surface Area: 2.05 m Body Mass Index: 40.74 kg/m  Pulse: 76 (Regular)  BP: 122/64 (Sitting, Right Arm, Standard)  Physical Exam General Mental Status -Alert, cooperative and good historian. General Appearance-pleasant, Not in acute distress. Orientation-Oriented X3. Build & Nutrition-Well nourished and Well developed.  Head and Neck Head-normocephalic, atraumatic . Neck Global Assessment - supple, no bruit auscultated on the right, no bruit auscultated on the left.  Eye Pupil - Bilateral-Regular and Round. Motion - Bilateral-EOMI.  Chest and Lung Exam Auscultation Breath sounds -  clear at anterior chest wall and clear at posterior chest wall. Adventitious sounds - No Adventitious sounds.  Cardiovascular Auscultation Rhythm - Regular rate and rhythm. Heart Sounds - S1 WNL and S2 WNL. Murmurs & Other Heart Sounds - Auscultation of the heart reveals - No Murmurs.  Abdomen Inspection Contour - Generalized moderate distention. Palpation/Percussion Tenderness - Abdomen is non-tender to palpation. Rigidity (guarding) - Abdomen is soft. Auscultation Auscultation of the abdomen reveals - Bowel sounds normal.  Female Genitourinary Note: Not done, not pertinent to present illness   Musculoskeletal Note: Evaluation of her hip show normal range of motion with discomfort. Right knee range 0 to 130, slight crepitus on range of motion. Tender medial greater than lateral, no instability noted. Pulse, sensation, and motor intact distally.  RADIOGRAPHS Right knee has narrowing but not fully bone on bone.   Assessment & Plan  Status post unicompartmental knee replacement, left (Z61.096) Primary osteoarthritis of right knee (M17.11)  Note:Surgical Plans: Right Unicompartmental Knee Replacement  Disposition: Home  PCP: Dr. Mikey Bussing  IV TXA  Anesthesia Issues: None  Signed electronically by Beckey Rutter, III PA-C

## 2016-08-05 NOTE — Anesthesia Postprocedure Evaluation (Signed)
Anesthesia Post Note  Patient: Kristin Barnett  Procedure(s) Performed: Procedure(s) (LRB): RIGHT UNICOMPARTMENTAL  ARTHROPLASTY (Right)  Patient location during evaluation: PACU Anesthesia Type: Spinal Level of consciousness: oriented and awake and alert Pain management: pain level controlled Vital Signs Assessment: post-procedure vital signs reviewed and stable Respiratory status: spontaneous breathing, respiratory function stable and patient connected to nasal cannula oxygen Cardiovascular status: blood pressure returned to baseline and stable Postop Assessment: no headache and no backache Anesthetic complications: no    Last Vitals:  Vitals:   08/05/16 1500 08/05/16 1515  BP: 140/79 137/66  Pulse: (!) 57 (!) 59  Resp: 13 17  Temp: 36.7 C     Last Pain:  Vitals:   08/05/16 1515  TempSrc:   PainSc: 1                  Travis Mastel Astrid DivineS Jahmier Willadsen

## 2016-08-06 DIAGNOSIS — M170 Bilateral primary osteoarthritis of knee: Secondary | ICD-10-CM | POA: Diagnosis not present

## 2016-08-06 LAB — BASIC METABOLIC PANEL
ANION GAP: 9 (ref 5–15)
BUN: 20 mg/dL (ref 6–20)
CO2: 29 mmol/L (ref 22–32)
Calcium: 10.2 mg/dL (ref 8.9–10.3)
Chloride: 100 mmol/L — ABNORMAL LOW (ref 101–111)
Creatinine, Ser: 1.07 mg/dL — ABNORMAL HIGH (ref 0.44–1.00)
GFR calc Af Amer: 56 mL/min — ABNORMAL LOW (ref >60)
GFR calc non Af Amer: 48 mL/min — ABNORMAL LOW (ref >60)
GLUCOSE: 228 mg/dL — AB (ref 65–99)
POTASSIUM: 4 mmol/L (ref 3.5–5.1)
Sodium: 138 mmol/L (ref 135–145)

## 2016-08-06 LAB — CBC
HEMATOCRIT: 37.3 % (ref 36.0–46.0)
HEMOGLOBIN: 13 g/dL (ref 12.0–15.0)
MCH: 31.4 pg (ref 26.0–34.0)
MCHC: 34.9 g/dL (ref 30.0–36.0)
MCV: 90.1 fL (ref 78.0–100.0)
Platelets: 221 10*3/uL (ref 150–400)
RBC: 4.14 MIL/uL (ref 3.87–5.11)
RDW: 12.3 % (ref 11.5–15.5)
WBC: 12.7 10*3/uL — ABNORMAL HIGH (ref 4.0–10.5)

## 2016-08-06 MED ORDER — RIVAROXABAN 10 MG PO TABS: 10.0000 mg | ORAL_TABLET | Freq: Every day | ORAL | 0 refills | Status: AC

## 2016-08-06 MED ORDER — METHOCARBAMOL 500 MG PO TABS: 500.0000 mg | ORAL_TABLET | Freq: Four times a day (QID) | ORAL | 0 refills | Status: AC | PRN

## 2016-08-06 MED ORDER — ONDANSETRON HCL 4 MG PO TABS: 4.0000 mg | ORAL_TABLET | Freq: Four times a day (QID) | ORAL | 0 refills | Status: AC | PRN

## 2016-08-06 MED ORDER — TRAMADOL HCL 50 MG PO TABS: 50.0000 mg | ORAL_TABLET | Freq: Four times a day (QID) | ORAL | 1 refills | Status: AC | PRN

## 2016-08-06 MED ORDER — OXYCODONE HCL 5 MG PO TABS: 5.0000 mg | ORAL_TABLET | ORAL | 0 refills | Status: AC | PRN

## 2016-08-06 NOTE — Discharge Summary (Signed)
Physician Discharge Summary   Patient ID: Kristin Barnett MRN: [id number] DOB/AGE: 29-Sep-1936 80 y.o.  Admit date: 08/05/2016 Discharge date: 08-06-2016  Primary Diagnosis:  Medial compartment osteoarthritis, Right knee  Admission Diagnoses:  Past Medical History:  Diagnosis Date  . Arthritis    oa  . Baker's cyst   . Bone disease   . Closed right humeral fracture    secondary to fall   . Falls   . GERD (gastroesophageal reflux disease)   . History of underactive thyroid   . Hypertension   . Hypothyroidism   . Obesity   . Tear of meniscus of knee    Discharge Diagnoses:   Active Problems:   OA (osteoarthritis) of knee  Estimated body mass index is 43.75 kg/m as calculated from the following:   Height as of this encounter: 5' 3"  (1.6 m).   Weight as of this encounter: 112 kg (247 lb).  Procedure:  Procedure(s) (LRB): RIGHT UNICOMPARTMENTAL  ARTHROPLASTY (Right)   Consults: None  HPI: Kristin Barnett is a 80 y.o. female, who has  significant isolated medial compartment arthritis of the Right knee. The patient has had nonoperative management including injections of cortisone and viscous supplements. Unfortunately, the pain persists.  Radiograph showed isolated medial compartment bone-on-bone arthritis  with normal-appearing patellofemoral and lateral compartments. The patient presents now for left knee unicompartmental arthroplasty.   Laboratory Data: Admission on 08/05/2016  Component Date Value Ref Range Status  . WBC 08/06/2016 12.7* 4.0 - 10.5 K/uL Final  . RBC 08/06/2016 4.14  3.87 - 5.11 MIL/uL Final  . Hemoglobin 08/06/2016 13.0  12.0 - 15.0 g/dL Final  . HCT 08/06/2016 37.3  36.0 - 46.0 % Final  . MCV 08/06/2016 90.1  78.0 - 100.0 fL Final  . MCH 08/06/2016 31.4  26.0 - 34.0 pg Final  . MCHC 08/06/2016 34.9  30.0 - 36.0 g/dL Final  . RDW 08/06/2016 12.3  11.5 - 15.5 % Final  . Platelets 08/06/2016 221  150 - 400 K/uL Final  . Sodium 08/06/2016 138  135 -  145 mmol/L Final  . Potassium 08/06/2016 4.0  3.5 - 5.1 mmol/L Final  . Chloride 08/06/2016 100* 101 - 111 mmol/L Final  . CO2 08/06/2016 29  22 - 32 mmol/L Final  . Glucose, Bld 08/06/2016 228* 65 - 99 mg/dL Final  . BUN 08/06/2016 20  6 - 20 mg/dL Final  . Creatinine, Ser 08/06/2016 1.07* 0.44 - 1.00 mg/dL Final  . Calcium 08/06/2016 10.2  8.9 - 10.3 mg/dL Final  . GFR calc non Af Amer 08/06/2016 48* >60 mL/min Final  . GFR calc Af Amer 08/06/2016 56* >60 mL/min Final   Comment: (NOTE) The eGFR has been calculated using the CKD EPI equation. This calculation has not been validated in all clinical situations. eGFR's persistently <60 mL/min signify possible Chronic Kidney Disease.   Georgiann Hahn gap 08/06/2016 9  5 - 15 Final  Hospital Outpatient Visit on 07/26/2016  Component Date Value Ref Range Status  . Color, Urine 07/26/2016 YELLOW  YELLOW Final  . APPearance 07/26/2016 CLEAR  CLEAR Final  . Specific Gravity, Urine 07/26/2016 1.007  1.005 - 1.030 Final  . pH 07/26/2016 6.0  5.0 - 8.0 Final  . Glucose, UA 07/26/2016 100* NEGATIVE mg/dL Final  . Hgb urine dipstick 07/26/2016 NEGATIVE  NEGATIVE Final  . Bilirubin Urine 07/26/2016 NEGATIVE  NEGATIVE Final  . Ketones, ur 07/26/2016 NEGATIVE  NEGATIVE mg/dL Final  . Protein, ur 07/26/2016 NEGATIVE  NEGATIVE mg/dL Final  . Nitrite 07/26/2016 NEGATIVE  NEGATIVE Final  . Leukocytes, UA 07/26/2016 NEGATIVE  NEGATIVE Final  . MRSA, PCR 07/26/2016 NEGATIVE  NEGATIVE Final  . Staphylococcus aureus 07/26/2016 NEGATIVE  NEGATIVE Final   Comment:        The Xpert SA Assay (FDA approved for NASAL specimens in patients over 60 years of age), is one component of a comprehensive surveillance program.  Test performance has been validated by [facility] for patients greater than or equal to 90 year old. It is not intended to diagnose infection nor to guide or monitor treatment.   Marland Kitchen aPTT 07/26/2016 29  24 - 36 seconds Final  . WBC 07/26/2016  5.9  4.0 - 10.5 K/uL Final  . RBC 07/26/2016 4.37  3.87 - 5.11 MIL/uL Final  . Hemoglobin 07/26/2016 13.6  12.0 - 15.0 g/dL Final  . HCT 07/26/2016 40.4  36.0 - 46.0 % Final  . MCV 07/26/2016 92.4  78.0 - 100.0 fL Final  . MCH 07/26/2016 31.1  26.0 - 34.0 pg Final  . MCHC 07/26/2016 33.7  30.0 - 36.0 g/dL Final  . RDW 07/26/2016 12.5  11.5 - 15.5 % Final  . Platelets 07/26/2016 198  150 - 400 K/uL Final  . Sodium 07/26/2016 139  135 - 145 mmol/L Final  . Potassium 07/26/2016 4.0  3.5 - 5.1 mmol/L Final  . Chloride 07/26/2016 103  101 - 111 mmol/L Final  . CO2 07/26/2016 30  22 - 32 mmol/L Final  . Glucose, Bld 07/26/2016 213* 65 - 99 mg/dL Final  . BUN 07/26/2016 23* 6 - 20 mg/dL Final  . Creatinine, Ser 07/26/2016 0.96  0.44 - 1.00 mg/dL Final  . Calcium 07/26/2016 10.4* 8.9 - 10.3 mg/dL Final  . Total Protein 07/26/2016 6.8  6.5 - 8.1 g/dL Final  . Albumin 07/26/2016 4.5  3.5 - 5.0 g/dL Final  . AST 07/26/2016 25  15 - 41 U/L Final  . ALT 07/26/2016 26  14 - 54 U/L Final  . Alkaline Phosphatase 07/26/2016 101  38 - 126 U/L Final  . Total Bilirubin 07/26/2016 0.7  0.3 - 1.2 mg/dL Final  . GFR calc non Af Amer 07/26/2016 55* >60 mL/min Final  . GFR calc Af Amer 07/26/2016 >60  >60 mL/min Final   Comment: (NOTE) The eGFR has been calculated using the CKD EPI equation. This calculation has not been validated in all clinical situations. eGFR's persistently <60 mL/min signify possible Chronic Kidney Disease.   . Anion gap 07/26/2016 6  5 - 15 Final  . Prothrombin Time 07/26/2016 12.0  11.4 - 15.2 seconds Final  . INR 07/26/2016 0.89   Final  . ABO/RH(D) 08/05/2016 O POS   Final  . Antibody Screen 08/05/2016 NEG   Final  . Sample Expiration 08/05/2016 08/08/2016   Final  . Extend sample reason 08/05/2016 NO TRANSFUSIONS OR PREGNANCY IN THE PAST 3 MONTHS   Final     X-Rays:No results found.  EKG: Orders placed or performed during the hospital encounter of 05/01/16  . EKG 12 lead   . EKG 12 lead     Hospital Course: Kristin Barnett is a 80 y.o. who was admitted to [facility]. They were brought to the operating room on 08/05/2016 and underwent Procedure(s): RIGHT UNICOMPARTMENTAL  ARTHROPLASTY.  Patient tolerated the procedure well and was later transferred to the recovery room and then to the orthopaedic floor for postoperative care.  They were given PO  and IV analgesics for pain control following their surgery.  They were given 24 hours of postoperative antibiotics of  Anti-infectives    Start     Dose/Rate Route Frequency Ordered Stop   08/05/16 1830  ceFAZolin (ANCEF) IVPB 2g/100 mL premix     2 g 200 mL/hr over 30 Minutes Intravenous Every 6 hours 08/05/16 1554 08/06/16 0055   08/05/16 0948  ceFAZolin (ANCEF) IVPB 2g/100 mL premix     2 g 200 mL/hr over 30 Minutes Intravenous On call to O.R. 08/05/16 1610 08/05/16 1238     and started on DVT prophylaxis in the form of Xarelto.   PT and OT were ordered for postop therapy protocol.  Discharge planning consulted to help with postop disposition and equipment needs.  Patient had a tough night on the evening of surgery but better the next morning.  They started to get up OOB with therapy on day one. Hemovac drain was pulled without difficulty.  Patient was seen in rounds on day one and it was felt that as long as they did well with the remaining sessions of therapy that they would be ready to go home.  Arrangements were made and they were setup to go home on POD 1.  Discharge home with home health, she wants to use [facility] in [place]. Diet - Cardiac diet Follow up - in 2 weeks Activity - WBAT Dressing - May remove the surgical dressing tomorrow at home and then apply a dry gauze dressing daily. May shower three days following surgery but do not submerge the incision under water. Disposition - Home Condition Upon Discharge - Good D/C Meds - See DC Summary DVT Prophylaxis Xarelto 10 mg daily for ten days,  then change to Aspirin 325 mg daily for two weeks, then reduce to Baby Aspirin 81 mg daily for three additional weeks.  Discharge Instructions    Call MD / Call 911    Complete by:  As directed    If you experience chest pain or shortness of breath, CALL 911 and be transported to the hospital emergency room.  If you develope a fever above 101 F, pus (white drainage) or increased drainage or redness at the wound, or calf pain, call your surgeon's office.   Change dressing    Complete by:  As directed    Change dressing daily with sterile 4 x 4 inch gauze dressing and apply TED hose. Do not submerge the incision under water.   Constipation Prevention    Complete by:  As directed    Drink plenty of fluids.  Prune juice may be helpful.  You may use a stool softener, such as Colace (over the counter) 100 mg twice a day.  Use MiraLax (over the counter) for constipation as needed.   Diet - low sodium heart healthy    Complete by:  As directed    Discharge instructions    Complete by:  As directed    Pick up stool softner and laxative for home use following surgery while on pain medications. Do not submerge incision under water. Please use good hand washing techniques while changing dressing each day. May shower starting three days after surgery. Please use a clean towel to pat the incision dry following showers. Continue to use ice for pain and swelling after surgery. Do not use any lotions or creams on the incision until instructed by your surgeon.   Postoperative Constipation Protocol  Constipation - defined medically as fewer than three stools  per week and severe constipation as less than one stool per week.  One of the most common issues patients have following surgery is constipation.  Even if you have a regular bowel pattern at home, your normal regimen is likely to be disrupted due to multiple reasons following surgery.  Combination of anesthesia, postoperative narcotics, change in  appetite and fluid intake all can affect your bowels.  In order to avoid complications following surgery, here are some recommendations in order to help you during your recovery period.  Colace (docusate) - Pick up an over-the-counter form of Colace or another stool softener and take twice a day as long as you are requiring postoperative pain medications.  Take with a full glass of water daily.  If you experience loose stools or diarrhea, hold the colace until you stool forms back up.  If your symptoms do not get better within 1 week or if they get worse, check with your doctor.  Dulcolax (bisacodyl) - Pick up over-the-counter and take as directed by the product packaging as needed to assist with the movement of your bowels.  Take with a full glass of water.  Use this product as needed if not relieved by Colace only.   MiraLax (polyethylene glycol) - Pick up over-the-counter to have on hand.  MiraLax is a solution that will increase the amount of water in your bowels to assist with bowel movements.  Take as directed and can mix with a glass of water, juice, soda, coffee, or tea.  Take if you go more than two days without a movement. Do not use MiraLax more than once per day. Call your doctor if you are still constipated or irregular after using this medication for 7 days in a row.  If you continue to have problems with postoperative constipation, please contact the office for further assistance and recommendations.  If you experience "the worst abdominal pain ever" or develop nausea or vomiting, please contact the office immediatly for further recommendations for treatment.   Xarelto 10 mg daily for ten days, then change to Aspirin 325 mg daily for two weeks, then reduce to Baby Aspirin 81 mg daily for three additional weeks.   Do not put a pillow under the knee. Place it under the heel.    Complete by:  As directed    Do not sit on low chairs, stoools or toilet seats, as it may be difficult to get up  from low surfaces    Complete by:  As directed    Driving restrictions    Complete by:  As directed    No driving until released by the physician.   Increase activity slowly as tolerated    Complete by:  As directed    Lifting restrictions    Complete by:  As directed    No lifting until released by the physician.   Patient may shower    Complete by:  As directed    You may shower without a dressing once there is no drainage.  Do not wash over the wound.  If drainage remains, do not shower until drainage stops.   TED hose    Complete by:  As directed    Use stockings (TED hose) for 3 weeks on both leg(s).  You may remove them at night for sleeping.   Weight bearing as tolerated    Complete by:  As directed    Laterality:  right   Extremity:  Lower  Medication List    STOP taking these medications   multivitamin with minerals Tabs tablet   Vitamin D3 5000 units Tabs     TAKE these medications   atenolol 50 MG tablet Commonly known as:  TENORMIN Take 50 mg by mouth at bedtime.   chlorthalidone 25 MG tablet Commonly known as:  HYGROTON Take 25 mg by mouth daily.   esomeprazole 20 MG capsule Commonly known as:  NEXIUM Take 20 mg by mouth every other day. Alternates with Zegerid. Takes at bedtime.   levothyroxine 50 MCG tablet Commonly known as:  SYNTHROID, LEVOTHROID Take 50 mcg by mouth daily before breakfast.   methocarbamol 500 MG tablet Commonly known as:  ROBAXIN Take 1 tablet (500 mg total) by mouth every 6 (six) hours as needed for muscle spasms.   Omeprazole-Sodium Bicarbonate 20-1100 MG Caps capsule Commonly known as:  ZEGERID Take 1 capsule by mouth daily. Alternates with Nexium. Takes at bedtime.   ondansetron 4 MG tablet Commonly known as:  ZOFRAN Take 1 tablet (4 mg total) by mouth every 6 (six) hours as needed for nausea.   oxyCODONE 5 MG immediate release tablet Commonly known as:  Oxy IR/ROXICODONE Take 1-2 tablets (5-10 mg total) by  mouth every 3 (three) hours as needed for moderate pain or severe pain.   pravastatin 10 MG tablet Commonly known as:  PRAVACHOL Take 10 mg by mouth at bedtime.   rivaroxaban 10 MG Tabs tablet Commonly known as:  XARELTO Take 1 tablet (10 mg total) by mouth daily with breakfast. Xarelto 10 mg daily for ten days, then change to Aspirin 325 mg daily for two weeks, then reduce to Baby Aspirin 81 mg daily for three additional weeks.   traMADol 50 MG tablet Commonly known as:  ULTRAM Take 1-2 tablets (50-100 mg total) by mouth every 6 (six) hours as needed (mild pain).      Follow-up Information    Gearlean Alf, MD. Schedule an appointment as soon as possible for a visit on 08/20/2016.   Specialty:  Orthopedic Surgery Contact information: [address] [place] [address] [phone]           Signed: Arlee Muslim, PA-C Orthopaedic Surgery 08/06/2016, 8:33 AM

## 2016-08-06 NOTE — Care Management Note (Signed)
Case Management Note  Patient Details  Name: Kristin Barnett MRN: [id number] Date of Birth: 07/14/1936  Subjective/Objective:                  RIGHT UNICOMPARTMENTAL  ARTHROPLASTY (Right) Action/Plan: Discharge planning Expected Discharge Date:  08/06/16               Expected Discharge Plan:  [name]  In-House Referral:     Discharge planning Services  CM Consult  Post Acute Care Choice:  Home Health Choice offered to:  Patient  DME Arranged:  N/A DME Agency:  NA  HH Arranged:  PT [place] Agency:  [facility]  Status of Service:  Completed, signed off  If discussed at [place] of Stay Meetings, dates discussed:    Additional Comments: CM met with pt in room to offer choice of hme health agency.  Pt chooses [facility] care to render HHPT.  Referral called to [facility] [phone] rep, Diane who requested I fax facesheet, orders, face to face, H&P, OP note and DC summary to [phone]. Pstates she has all DME needed from previous surgery.  No other CM needs were communicated. Dellie Catholic, RN 08/06/2016, 11:25 AM

## 2016-08-06 NOTE — Progress Notes (Signed)
RN reviewed discharge instructions with patient and family. All questions answered.   Paperwork and prescriptions given.   NT rolled patient down with all belongings to family car. 

## 2016-08-06 NOTE — Evaluation (Signed)
Occupational Therapy Evaluation Patient Details Name: Kristin Barnett MRN: [id number] DOB: 12/21/1935 Today's Date: 08/06/2016    History of Present Illness R uni knee   Clinical Impression   This 80 year old female was admitted for the above sx. All education was completed. No further OT is needed at this time    Follow Up Recommendations  No OT follow up;Supervision/Assistance - 24 hour    Equipment Recommendations  None recommended by OT    Recommendations for Other Services       Precautions / Restrictions Precautions Precautions: Knee Required Braces or Orthoses: Knee Immobilizer - Right Restrictions Weight Bearing Restrictions: No      Mobility Bed Mobility Overal bed mobility: Needs Assistance Bed Mobility: Supine to Sit     Supine to sit: Min assist     General bed mobility comments: light min A for RLE. Pt used hands to assist  Transfers Overall transfer level: Needs assistance Equipment used: Rolling walker (2 wheeled) Transfers: Sit to/from Stand Sit to Stand: Min assist         General transfer comment: assist to rise and steady. Cues for UE/LE placement    Balance                                            ADL Overall ADL's : Needs assistance/impaired     Grooming: Supervision/safety;Standing   Upper Body Bathing: Minimal assitance;Sitting   Lower Body Bathing: Moderate assistance;Sit to/from stand   Upper Body Dressing : Minimal assistance;Sitting (gown)   Lower Body Dressing: Maximal assistance;Sit to/from stand   Toilet Transfer: Minimal assistance;Ambulation;Comfort height toilet;Grab bars;RW   Toileting- Clothing Manipulation and Hygiene: Maximal assistance;Sit to/from stand         General ADL Comments: will have 24/7 at home.  States this knee is much worse than last one.  Plans to sponge bathe as she has a tub. Showed toilet aide.  She has rigged up something long handled to use at home.  Cues for  safety with transfer, making sure to feel surface behind LLE. Also cued for keeping walker closer when walking.  Pt does have a high commode at home, but she is not able to wipe herself as well on it.  Recommended putting a small stool under her R foot, if she cannot comfortably rest foot on floor when toileting     Vision     Perception     Praxis      Pertinent Vitals/Pain Pain Assessment: Faces Faces Pain Scale: Hurts little more Pain Location: R knee Pain Descriptors / Indicators: Aching Pain Intervention(s): Limited activity within patient's tolerance;Monitored during session;Premedicated before session;Repositioned;Ice applied     Hand Dominance     Extremity/Trunk Assessment Upper Extremity Assessment Upper Extremity Assessment: RUE deficits/detail RUE Deficits / Details: old shoulder sx. Can raise to approximately 80 with shoulder elevation.  Cannot reach hair nor wipe with this hand; LUE WFLs           Communication Communication Communication: No difficulties   Cognition Arousal/Alertness: Awake/alert Behavior During Therapy: WFL for tasks assessed/performed Overall Cognitive Status: Within Functional Limits for tasks assessed                     General Comments       Exercises       Shoulder Instructions  Home Living Family/patient expects to be discharged to:: Private residence Living Arrangements: Children Available Help at Discharge: Family;Friend(s)               Bathroom Shower/Tub: Chief Strategy Officer: Standard     Home Equipment: Environmental consultant - 2 wheels;Walker - 4 wheels;Cane - single point;Bedside commode;Shower seat;Grab bars - toilet   Additional Comments: Lift chair, sleeps in it      Prior Functioning/Environment Level of Independence: Independent with assistive device(s)                 OT Problem List:     OT Treatment/Interventions:      OT Goals(Current goals can be found in the care plan  section) Acute Rehab OT Goals Patient Stated Goal: less pain OT Goal Formulation: All assessment and education complete, DC therapy  OT Frequency:     Barriers to D/C:            Co-evaluation              End of Session    Activity Tolerance: Patient tolerated treatment well Patient left: in chair;with call bell/phone within reach   Time: 0747-0810 OT Time Calculation (min): 23 min Charges:  OT General Charges $OT Visit: 1 Procedure OT Evaluation $OT Eval Low Complexity: 1 Procedure G-Codes: OT G-codes **NOT FOR INPATIENT CLASS** Functional Assessment Tool Used: clinical judgment and observation Functional Limitation: Self care Self Care Current Status (Z6109): At least 60 percent but less than 80 percent impaired, limited or restricted Self Care Goal Status (U0454): At least 60 percent but less than 80 percent impaired, limited or restricted Self Care Discharge Status [phone]): At least 60 percent but less than 80 percent impaired, limited or restricted  Kristin Barnett 08/06/2016, 8:22 AM Marica Otter, OTR/L [phone] 08/06/2016

## 2016-08-06 NOTE — Evaluation (Signed)
Physical Therapy Evaluation Patient Details Name: Kristin Barnett MRN: [id number] DOB: Aug 10, 1936 Today's Date: 08/06/2016   History of Present Illness  R uni knee  Clinical Impression  The patient reports that this knee is more painful. Ready for DC.    Follow Up Recommendations Home health PT    Equipment Recommendations  3in1 (PT)    Recommendations for Other Services       Precautions / Restrictions Precautions Precautions: Knee Required Braces or Orthoses: Knee Immobilizer - Right      Mobility  Bed Mobility               General bed mobility comments: in recliner  Transfers Overall transfer level: Needs assistance Equipment used: Rolling walker (2 wheeled)   Sit to Stand: Min assist;Min guard         General transfer comment: assist to rise and steady. Cues for UE/LE placement  Ambulation/Gait Ambulation/Gait assistance: Min assist Ambulation Distance (Feet): 50 Feet Assistive device: Rolling walker (2 wheeled) Gait Pattern/deviations: Step-to pattern;Decreased stance time - right;Antalgic     General Gait Details: cues for sequence, patient reports that this knee hurts worse  Stairs            Wheelchair Mobility    Modified Rankin (Stroke Patients Only)       Balance                                             Pertinent Vitals/Pain Faces Pain Scale: Hurts even more Pain Location: R knee Pain Descriptors / Indicators: Aching;Tightness Pain Intervention(s): Monitored during session;Premedicated before session;Repositioned    Home Living Family/patient expects to be discharged to:: Private residence Living Arrangements: Children Available Help at Discharge: Family;Friend(s) Type of Home: House Home Access: Ramped entrance     Home Layout: One level Home Equipment: Environmental consultant - 2 wheels;Walker - 4 wheels;Cane - single point;Bedside commode;Shower seat;Grab bars - toilet Additional Comments: Lift chair, sleeps  in it    Prior Function Level of Independence: Independent with assistive device(s)               Hand Dominance        Extremity/Trunk Assessment   Upper Extremity Assessment: Defer to OT evaluation RUE Deficits / Details: old shoulder sx. Can raise to approximately 80 with shoulder elevation.  Cannot reach hair nor wipe with this hand; LUE WFLs per OT note/         Lower Extremity Assessment: RLE deficits/detail RLE Deficits / Details: able to extend knee to 80 in sitting. knee flexion 50       Communication   Communication: No difficulties  Cognition Arousal/Alertness: Awake/alert Behavior During Therapy: WFL for tasks assessed/performed Overall Cognitive Status: Within Functional Limits for tasks assessed                      General Comments      Exercises Total Joint Exercises Ankle Circles/Pumps: AROM;Both;5 reps Quad Sets: AROM;Both;5 reps Heel Slides: AAROM;Left;5 reps Straight Leg Raises: Left;5 reps   Assessment/Plan    PT Assessment All further PT needs can be met in the next venue of care  PT Problem List Decreased strength;Decreased range of motion;Decreased activity tolerance;Decreased balance;Decreased safety awareness;Decreased knowledge of precautions;Decreased knowledge of use of DME          PT Treatment Interventions  PT Goals (Current goals can be found in the Care Plan section)  Acute Rehab PT Goals Patient Stated Goal: , go home PT Goal Formulation: With patient/family    Frequency     Barriers to discharge        Co-evaluation               End of Session   Activity Tolerance: Patient tolerated treatment well Patient left: in chair;with call bell/phone within reach;with nursing/sitter in room;with family/visitor present Nurse Communication: Mobility status    Functional Assessment Tool Used: clinical judgement Functional Limitation: Mobility: Walking and moving around Mobility: Walking and Moving  Around Current Status (J6283): At least 1 percent but less than 20 percent impaired, limited or restricted Mobility: Walking and Moving Around Goal Status [phone]): At least 1 percent but less than 20 percent impaired, limited or restricted Mobility: Walking and Moving Around Discharge Status [phone]): At least 1 percent but less than 20 percent impaired, limited or restricted    Time: 1020-1050 PT Time Calculation (min) (ACUTE ONLY): 30 min   Charges:   PT Evaluation $PT Eval Low Complexity: 1 Procedure PT Treatments $Gait Training: 8-22 mins   PT G Codes:   PT G-Codes **NOT FOR INPATIENT CLASS** Functional Assessment Tool Used: clinical judgement Functional Limitation: Mobility: Walking and moving around Mobility: Walking and Moving Around Current Status (T0354): At least 1 percent but less than 20 percent impaired, limited or restricted Mobility: Walking and Moving Around Goal Status [phone]): At least 1 percent but less than 20 percent impaired, limited or restricted Mobility: Walking and Moving Around Discharge Status [phone]): At least 1 percent but less than 20 percent impaired, limited or restricted    Claretha Cooper 08/06/2016, 12:24 PM Tresa Endo PT [phone]

## 2016-08-06 NOTE — Progress Notes (Signed)
   Subjective: 1 Day Post-Op Procedure(s) (LRB): RIGHT UNICOMPARTMENTAL  ARTHROPLASTY (Right) Patient reports pain as mild.   Patient seen in rounds for Dr. Lequita HaltAluisio.  Pain last night but better today. Patient is well, but has had some minor complaints of pain in the knee, requiring pain medications Patient is ready to go home later today.  Objective: Vital signs in last 24 hours: Temp:  [97.6 F (36.4 C)-98.5 F (36.9 C)] 97.6 F (36.4 C) (10/10 0644) Pulse Rate:  [54-75] 64 (10/10 0644) Resp:  [12-18] 16 (10/10 0644) BP: (113-158)/(55-84) 142/61 (10/10 0644) SpO2:  [94 %-100 %] 100 % (10/10 0644) Weight:  [112 kg (247 lb)] 112 kg (247 lb) (10/09 1545)  Intake/Output from previous day:  Intake/Output Summary (Last 24 hours) at 08/06/16 0827 Last data filed at 08/06/16 0818  Gross per 24 hour  Intake             2725 ml  Output             1000 ml  Net             1725 ml    Intake/Output this shift: Total I/O In: 120 [P.O.:120] Out: -   Labs:  Recent Labs  08/06/16 0433  HGB 13.0    Recent Labs  08/06/16 0433  WBC 12.7*  RBC 4.14  HCT 37.3  PLT 221    Recent Labs  08/06/16 0433  NA 138  K 4.0  CL 100*  CO2 29  BUN 20  CREATININE 1.07*  GLUCOSE 228*  CALCIUM 10.2   No results for input(s): LABPT, INR in the last 72 hours.  EXAM: General - Patient is Alert, Appropriate and Oriented Extremity - Neurovascular intact Sensation intact distally Dorsiflexion/Plantar flexion intact Dressing - clean, dry, no drainage Motor Function - intact, moving foot and toes well on exam.  Hemovac pulled without difficulty.  Assessment/Plan: 1 Day Post-Op Procedure(s) (LRB): RIGHT UNICOMPARTMENTAL  ARTHROPLASTY (Right) Procedure(s) (LRB): RIGHT UNICOMPARTMENTAL  ARTHROPLASTY (Right) Past Medical History:  Diagnosis Date  . Arthritis    oa  . Baker's cyst   . Bone disease   . Closed right humeral fracture    secondary to fall   . Falls   . GERD  (gastroesophageal reflux disease)   . History of underactive thyroid   . Hypertension   . Hypothyroidism   . Obesity   . Tear of meniscus of knee    Active Problems:   OA (osteoarthritis) of knee  Estimated body mass index is 43.75 kg/m as calculated from the following:   Height as of this encounter: 5\' 3"  (1.6 m).   Weight as of this encounter: 112 kg (247 lb). Up with therapy Discharge home with home health, she wants to use [facility] in [place]. Diet - Cardiac diet Follow up - in 2 weeks Activity - WBAT Dressing - May remove the surgical dressing tomorrow at home and then apply a dry gauze dressing daily. May shower three days following surgery but do not submerge the incision under water. Disposition - Home Condition Upon Discharge - Good D/C Meds - See DC Summary DVT Prophylaxis Xarelto 10 mg daily for ten days, then change to Aspirin 325 mg daily for two weeks, then reduce to Baby Aspirin 81 mg daily for three additional weeks.  Avel Peacerew Perkins, PA-C Orthopaedic Surgery 08/06/2016, 8:27 AM

## 2016-08-06 NOTE — Discharge Instructions (Addendum)
° °Dr. Frank Aluisio °[facility] Specialist °[facility] °[address] °[place], [place] [address] °[phone] ° °UNI KNEE REPLACEMENT POSTOPERATIVE DIRECTIONS ° ° °Knee Rehabilitation, Guidelines Following Surgery  °Results after knee surgery are often greatly improved when you follow the exercise, range of motion and muscle strengthening exercises prescribed by your doctor. Safety measures are also important to protect the knee from further injury. Any time any of these exercises cause you to have increased pain or swelling in your knee joint, decrease the amount until you are comfortable again and slowly increase them. If you have problems or questions, call your caregiver or physical therapist for advice.  ° °HOME CARE INSTRUCTIONS  °Remove items at home which could result in a fall. This includes throw rugs or furniture in walking pathways.  °· ICE to the affected knee every three hours for 30 minutes at a time and then as needed for pain and swelling.  Continue to use ice on the knee for pain and swelling from surgery. You may notice swelling that will progress down to the foot and ankle.  This is normal after surgery.  Elevate the leg when you are not up walking on it.   °· Continue to use the breathing machine which will help keep your temperature down.  It is common for your temperature to cycle up and down following surgery, especially at night when you are not up moving around and exerting yourself.  The breathing machine keeps your lungs expanded and your temperature down. °· Do not place pillow under knee, focus on keeping the knee straight while resting ° °DIET °You may resume your previous home diet once your are discharged from the hospital. ° °DRESSING / WOUND CARE / SHOWERING °You may shower 3 days after surgery, but keep the wounds dry during showering.  You may use an occlusive plastic wrap (Press'n Seal for example), NO SOAKING/SUBMERGING IN THE BATHTUB.  If the  bandage gets wet, change with a clean dry gauze.  If the incision gets wet, pat the wound dry with a clean towel. °You may start showering once you are discharged home but do not submerge the incision under water. Just pat the incision dry and apply a dry gauze dressing on daily. °Change the surgical dressing daily and reapply a dry dressing each time. ° °ACTIVITY °Walk with your walker as instructed. °Use walker as long as suggested by your caregivers. °Avoid periods of inactivity such as sitting longer than an hour when not asleep. This helps prevent blood clots.  °You may resume a sexual relationship in one month or when given the OK by your doctor.  °You may return to work once you are cleared by your doctor.  °Do not drive a car for 6 weeks or until released by you surgeon.  °Do not drive while taking narcotics. ° °WEIGHT BEARING °Weight bearing as tolerated with assist device (walker, cane, etc) as directed, use it as long as suggested by your surgeon or therapist, typically at least 4-6 weeks. ° °POSTOPERATIVE CONSTIPATION PROTOCOL °Constipation - defined medically as fewer than three stools per week and severe constipation as less than one stool per week. ° °One of the most common issues patients have following surgery is constipation.  Even if you have a regular bowel pattern at home, your normal regimen is likely to be disrupted due to multiple reasons following surgery.  Combination of anesthesia, postoperative narcotics, change in appetite and fluid intake all can affect your bowels.    In order to avoid complications following surgery, here are some recommendations in order to help you during your recovery period. ° °Colace (docusate) - Pick up an over-the-counter form of Colace or another stool softener and take twice a day as long as you are requiring postoperative pain medications.  Take with a full glass of water daily.  If you experience loose stools or diarrhea, hold the colace until you stool forms  back up.  If your symptoms do not get better within 1 week or if they get worse, check with your doctor. ° °Dulcolax (bisacodyl) - Pick up over-the-counter and take as directed by the product packaging as needed to assist with the movement of your bowels.  Take with a full glass of water.  Use this product as needed if not relieved by Colace only.  ° °MiraLax (polyethylene glycol) - Pick up over-the-counter to have on hand.  MiraLax is a solution that will increase the amount of water in your bowels to assist with bowel movements.  Take as directed and can mix with a glass of water, juice, soda, coffee, or tea.  Take if you go more than two days without a movement. °Do not use MiraLax more than once per day. Call your doctor if you are still constipated or irregular after using this medication for 7 days in a row. ° °If you continue to have problems with postoperative constipation, please contact the office for further assistance and recommendations.  If you experience "the worst abdominal pain ever" or develop nausea or vomiting, please contact the office immediatly for further recommendations for treatment. ° °ITCHING ° If you experience itching with your medications, try taking only a single pain pill, or even half a pain pill at a time.  You can also use Benadryl over the counter for itching or also to help with sleep.  ° °TED HOSE STOCKINGS °Wear the elastic stockings on both legs for three weeks following surgery during the day but you may remove then at night for sleeping. ° °MEDICATIONS °See your medication summary on the “After Visit Summary” that the nursing staff will review with you prior to discharge.  You may have some home medications which will be placed on hold until you complete the course of blood thinner medication.  It is important for you to complete the blood thinner medication as prescribed by your surgeon.  Continue your approved medications as instructed at time of  discharge. ° °PRECAUTIONS °If you experience chest pain or shortness of breath - call 911 immediately for transfer to the hospital emergency department.  °If you develop a fever greater that 101 F, purulent drainage from wound, increased redness or drainage from wound, foul odor from the wound/dressing, or calf pain - CONTACT YOUR SURGEON.   °                                                °FOLLOW-UP APPOINTMENTS °Make sure you keep all of your appointments after your operation with your surgeon and caregivers. You should call the office at the above phone number and make an appointment for approximately two weeks after the date of your surgery or on the date instructed by your surgeon outlined in the "After Visit Summary". ° °RANGE OF MOTION AND STRENGTHENING EXERCISES  °Rehabilitation of the knee is important following a knee injury or an   operation. After just a few days of immobilization, the muscles of the thigh which control the knee become weakened and shrink (atrophy). Knee exercises are designed to build up the tone and strength of the thigh muscles and to improve knee motion. Often times heat used for twenty to thirty minutes before working out will loosen up your tissues and help with improving the range of motion but do not use heat for the first two weeks following surgery. These exercises can be done on a training (exercise) mat, on the floor, on a table or on a bed. Use what ever works the best and is most comfortable for you Knee exercises include:  °Leg Lifts - While your knee is still immobilized in a splint or cast, you can do straight leg raises. Lift the leg to 60 degrees, hold for 3 sec, and slowly lower the leg. Repeat 10-20 times 2-3 times daily. Perform this exercise against resistance later as your knee gets better.  °Quad and Hamstring Sets - Tighten up the muscle on the front of the thigh (Quad) and hold for 5-10 sec. Repeat this 10-20 times hourly. Hamstring sets are done by pushing the  foot backward against an object and holding for 5-10 sec. Repeat as with quad sets.  °· Leg Slides: Lying on your back, slowly slide your foot toward your buttocks, bending your knee up off the floor (only go as far as is comfortable). Then slowly slide your foot back down until your leg is flat on the floor again. °· Angel Wings: Lying on your back spread your legs to the side as far apart as you can without causing discomfort.  °A rehabilitation program following serious knee injuries can speed recovery and prevent re-injury in the future due to weakened muscles. Contact your doctor or a physical therapist for more information on knee rehabilitation.  ° °IF YOU ARE TRANSFERRED TO A SKILLED REHAB FACILITY °If the patient is transferred to a skilled rehab facility following release from the hospital, a list of the current medications will be sent to the facility for the patient to continue.  When discharged from the skilled rehab facility, please have the facility set up the patient's Home Health Physical Therapy prior to being released. Also, the skilled facility will be responsible for providing the patient with their medications at time of release from the facility to include their pain medication, the muscle relaxants, and their blood thinner medication. If the patient is still at the rehab facility at time of the two week follow up appointment, the skilled rehab facility will also need to assist the patient in arranging follow up appointment in our office and any transportation needs. ° °MAKE SURE YOU:  °Understand these instructions.  °Get help right away if you are not doing well or get worse.  ° ° °Pick up stool softner and laxative for home use following surgery while on pain medications. °Do not submerge incision under water. °Please use good hand washing techniques while changing dressing each day. °May shower starting three days after surgery. °Please use a clean towel to pat the incision dry following  showers. °Continue to use ice for pain and swelling after surgery. °Do not use any lotions or creams on the incision until instructed by your surgeon. ° ° °Take Xarelto 10 mg daily for ten days, then change to Aspirin 325 mg daily for two weeks, then reduce to Baby Aspirin 81 mg daily for three additional weeks. ° °Information on my medicine -   XARELTO® (Rivaroxaban) ° °This medication education was reviewed with me or my healthcare representative as part of my discharge preparation.  The pharmacist that spoke with me during my hospital stay was:  Dajuan Turnley, Student-PharmD ° °Why was Xarelto® prescribed for you? °Xarelto® was prescribed for you to reduce the risk of blood clots forming after orthopedic surgery. The medical term for these abnormal blood clots is venous thromboembolism (VTE). ° °What do you need to know about xarelto® ? °Take your Xarelto® ONCE DAILY at the same time every day. °You may take it either with or without food. ° °If you have difficulty swallowing the tablet whole, you may crush it and mix in applesauce just prior to taking your dose. ° °Take Xarelto® exactly as prescribed by your doctor and DO NOT stop taking Xarelto® without talking to the doctor who prescribed the medication.  Stopping without other VTE prevention medication to take the place of Xarelto® may increase your risk of developing a clot. ° °After discharge, you should have regular check-up appointments with your healthcare provider that is prescribing your Xarelto®.   ° °What do you do if you miss a dose? °If you miss a dose, take it as soon as you remember on the same day then continue your regularly scheduled once daily regimen the next day. Do not take two doses of Xarelto® on the same day.  ° °Important Safety Information °A possible side effect of Xarelto® is bleeding. You should call your healthcare provider right away if you experience any of the following: °? Bleeding from an injury or your nose that does not  stop. °? Unusual colored urine (red or dark brown) or unusual colored stools (red or black). °? Unusual bruising for unknown reasons. °? A serious fall or if you hit your head (even if there is no bleeding). ° °Some medicines may interact with Xarelto® and might increase your risk of bleeding while on Xarelto®. To help avoid this, consult your healthcare provider or pharmacist prior to using any new prescription or non-prescription medications, including herbals, vitamins, non-steroidal anti-inflammatory drugs (NSAIDs) and supplements. ° °This website has more information on Xarelto®: www.[email or web address].com. ° ° ° ° °

## 2018-02-26 IMAGING — CT CT HIP*R* W/O CM
2 of 5 series · 6 of 14 positions shown, 7 images · non-contrast
Comparison: None.

CLINICAL DATA: Patient for right knee replacement. Preoperative
planning examination. Conformis protocol.

EXAM:
CT OF THE RIGHT KNEE WITHOUT CONTRAST
TECHNIQUE: Multidetector CT imaging of the right knee was performed according
to the standard protocol. Multiplanar CT image reconstructions were
also generated. Axial imaging only of the right hip and ankle was
also performed.

[Series 300: sag · sagittal · 0.65mm/px · 3 of 187 slices shown, 4 images]
[im 47/187  soft-tissue]
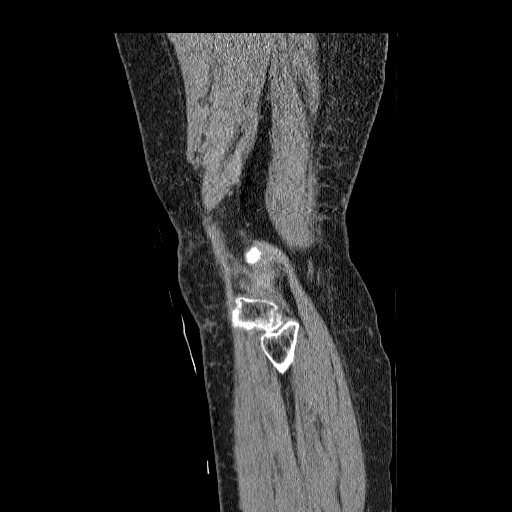
[im 47/187  bone]
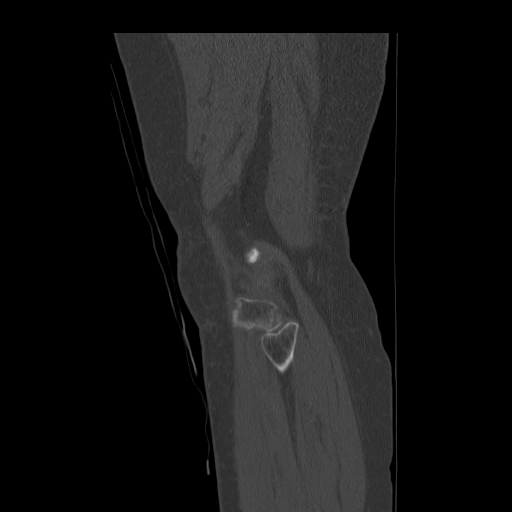
[im 94/187  soft-tissue]
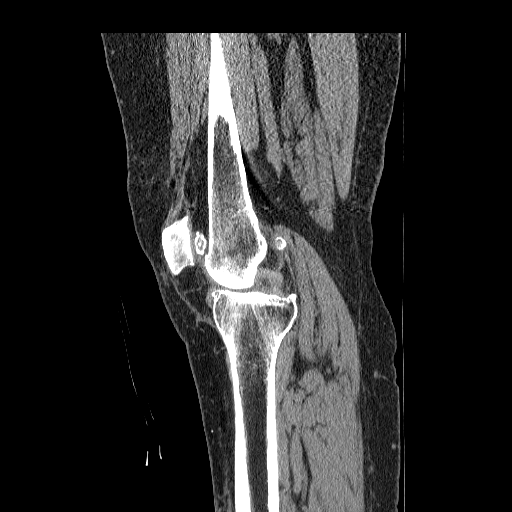
[im 140/187  soft-tissue]
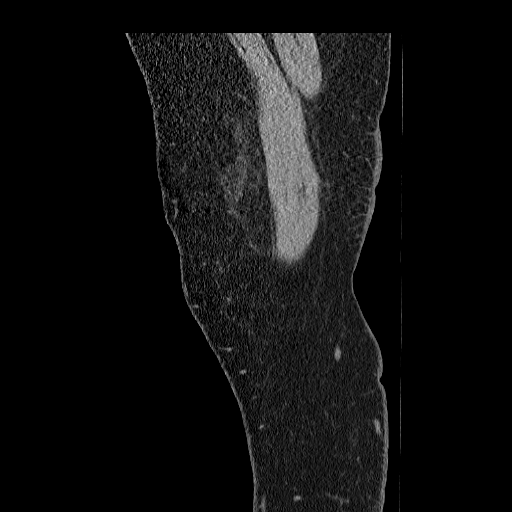

[Series 301: cor · coronal · 0.65mm/px · 3 of 187 slices shown]
[im 47/187  soft-tissue]
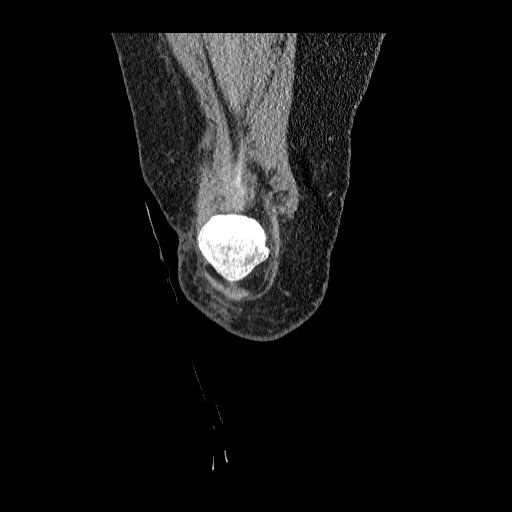
[im 94/187  soft-tissue]
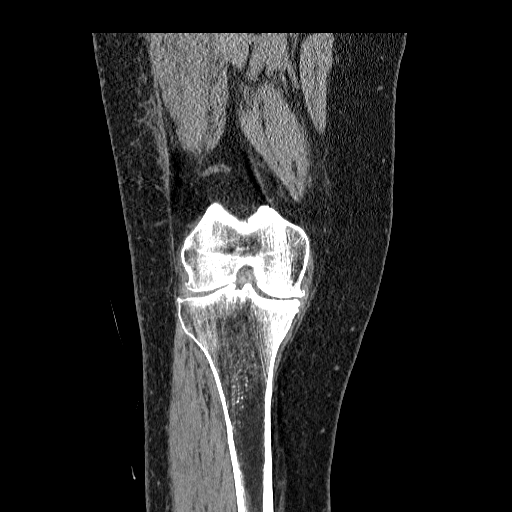
[im 140/187  soft-tissue]
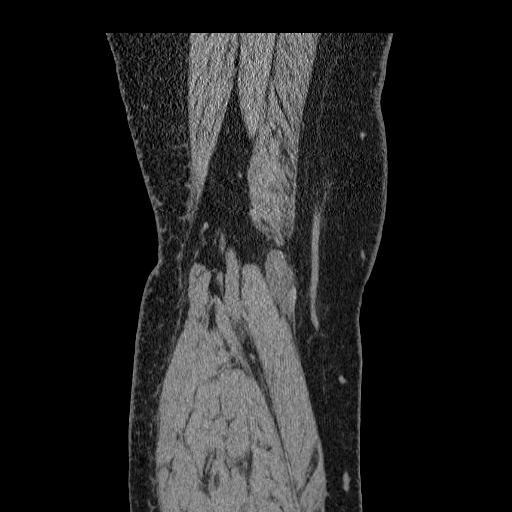

[6 of 14 positions shown; findings below may reference images not displayed]

FINDINGS: The patient has advanced osteoarthritis about the right knee.
Degenerative change appears worst in the medial compartment where
there is near bone-on-bone joint space narrowing and subchondral
sclerosis. A loose body measuring 1.4 x 1.1 x 0.6 cm is seen at the
level of the mid pole of the patella at the patellar apex. No joint
effusion. No acute abnormality.

Limited imaging of the right hip and ankle demonstrates no acute
abnormality. The patient is status post calcaneal osteotomy which is
healed. There is some degenerative change at the calcaneocuboid
joint.
IMPRESSION: Advanced tricompartmental osteoarthritis as described above. Large
loose body in the patellofemoral compartment is noted. No acute
abnormality.

Healed calcaneal osteotomy with fixation hardware in place.
